# Patient Record
Sex: Female | Born: 1995
Health system: Southern US, Community
[De-identification: ages and names within clinical notes are randomized; demographics above are authoritative.]

## PROBLEM LIST (undated history)

## (undated) DIAGNOSIS — D241 Benign neoplasm of right breast: Secondary | ICD-10-CM

## (undated) DIAGNOSIS — G43909 Migraine, unspecified, not intractable, without status migrainosus: Secondary | ICD-10-CM

## (undated) DIAGNOSIS — Z23 Encounter for immunization: Secondary | ICD-10-CM

## (undated) DIAGNOSIS — IMO0001 Reserved for inherently not codable concepts without codable children: Secondary | ICD-10-CM

## (undated) HISTORY — PX: NO PAST SURGERIES: SHX2092

## (undated) HISTORY — DX: Migraine, unspecified, not intractable, without status migrainosus: G43.909

## (undated) HISTORY — DX: Encounter for immunization: Z23

## (undated) HISTORY — DX: Reserved for inherently not codable concepts without codable children: IMO0001

## (undated) HISTORY — DX: Benign neoplasm of right breast: D24.1

---
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
## 2016-08-22 ENCOUNTER — Ambulatory Visit (INDEPENDENT_AMBULATORY_CARE_PROVIDER_SITE_OTHER): Payer: Self-pay | Admitting: Orthopedic Surgery

## 2016-08-22 DIAGNOSIS — M25561 Pain in right knee: Secondary | ICD-10-CM

## 2016-08-23 ENCOUNTER — Other Ambulatory Visit (INDEPENDENT_AMBULATORY_CARE_PROVIDER_SITE_OTHER): Payer: Self-pay | Admitting: Orthopedic Surgery

## 2016-08-23 DIAGNOSIS — M25562 Pain in left knee: Secondary | ICD-10-CM

## 2016-08-26 ENCOUNTER — Encounter: Payer: Self-pay | Admitting: Orthopedic Surgery

## 2016-08-26 DIAGNOSIS — M25562 Pain in left knee: Secondary | ICD-10-CM | POA: Diagnosis not present

## 2016-08-26 NOTE — Progress Notes (Signed)
   Post-Op Visit Note   Patient: Tina Peterson           Date of Birth: [birth year]           MRN: [id number] Visit Date: 08/22/2016 PCP: No primary care provider on file.   Assessment & Plan:  Chief Complaint: No chief complaint on file.  Visit Diagnoses: No diagnosis found.  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  She was being propped up and then when she came back down she felt a pop in that knee.  Hurts on the medial side.  She does describe some popping past 90.  Weightbearing is difficult.  On exam her collateral and cruciate still stable but she does have some medial joint line tenderness.  There is no posterior lateral rotatory instability noted.  Pedal pulses intact.  Plan MRI scan to evaluate for what seems like may be a meniscal tear.  Occult anterior cruciate ligament injury also possible but her exam is not convincing for that today.  We'll see if the scan shows in regards to [facility] versus medial meniscal tear.  Follow-Up Instructions: Return if symptoms worsen or fail to improve.   Orders:  No orders of the defined types were placed in this encounter.  No orders of the defined types were placed in this encounter.   Imaging: No results found.  PMFS History: There are no active problems to display for this patient.  No past medical history on file.  No family history on file.  No past surgical history on file. Social History   Occupational History  . Not on file.   Social History Main Topics  . Smoking status: Not on file  . Smokeless tobacco: Not on file  . Alcohol use Not on file  . Drug use: Unknown  . Sexual activity: Not on file

## 2016-09-01 ENCOUNTER — Telehealth (INDEPENDENT_AMBULATORY_CARE_PROVIDER_SITE_OTHER): Payer: Self-pay | Admitting: *Deleted

## 2016-09-01 NOTE — Telephone Encounter (Signed)
PT mother called for MRI results, or if pt needs to be seen again. Can call pt with results. Pt has some games coming up that she cheers at and isnt sure if she is able to do this or if she needs a brace.

## 2016-09-01 NOTE — Telephone Encounter (Signed)
Dr Marlou Sa has spoken with trainer who was to speak with patient.

## 2016-09-12 ENCOUNTER — Ambulatory Visit (INDEPENDENT_AMBULATORY_CARE_PROVIDER_SITE_OTHER): Payer: Self-pay | Admitting: Orthopedic Surgery

## 2016-09-12 DIAGNOSIS — M25569 Pain in unspecified knee: Secondary | ICD-10-CM

## 2016-10-24 ENCOUNTER — Ambulatory Visit (INDEPENDENT_AMBULATORY_CARE_PROVIDER_SITE_OTHER): Payer: 59 | Admitting: Obstetrics and Gynecology

## 2016-10-24 ENCOUNTER — Encounter: Payer: Self-pay | Admitting: Obstetrics and Gynecology

## 2016-10-24 VITALS — BP 110/70 | Ht 65.0 in | Wt 105.0 lb

## 2016-10-24 DIAGNOSIS — Z3041 Encounter for surveillance of contraceptive pills: Secondary | ICD-10-CM | POA: Diagnosis not present

## 2016-10-24 DIAGNOSIS — Z01419 Encounter for gynecological examination (general) (routine) without abnormal findings: Secondary | ICD-10-CM

## 2016-10-24 DIAGNOSIS — Z113 Encounter for screening for infections with a predominantly sexual mode of transmission: Secondary | ICD-10-CM

## 2016-10-24 MED ORDER — LEVONORGESTREL-ETHINYL ESTRAD 0.1-20 MG-MCG PO TABS: 1.0000 | ORAL_TABLET | Freq: Every day | ORAL | 12 refills | Status: DC

## 2016-10-24 NOTE — Progress Notes (Signed)
HPI:      [name] is a 21 y.o. G0P0000 who LMP was Patient's last menstrual period was 10/17/2016., presents today for her annual examination.  Her menses are monthly on OCPs, but occur during different times of her pill pack, including active pills (without late/missed OCPs), lasting 5 days.  Dysmenorrhea none. She does not have intermenstrual bleeding.  Sex activity: single partner, contraception - OCP (estrogen/progesterone).   Hx of STDs: none  There is no FH of breast cancer. There is no FH of ovarian cancer. The patient does not do self-breast exams.  Tobacco use: The patient denies current or previous tobacco use. Alcohol use: none Exercise: very active  She does not get adequate calcium and Vitamin D in her diet.    History reviewed. No pertinent past medical history.  History reviewed. No pertinent surgical history.  Family History  Problem Relation Age of Onset  . Hypertension Father   . Migraines Paternal Grandfather      ROS:  Review of Systems  Constitutional: Negative for fever, malaise/fatigue and weight loss.  HENT: Negative for congestion, ear pain and sinus pain.   Respiratory: Negative for cough, shortness of breath and wheezing.   Cardiovascular: Negative for chest pain, orthopnea and leg swelling.  Gastrointestinal: Negative for constipation, diarrhea, nausea and vomiting.  Genitourinary: Negative for dysuria, frequency, hematuria and urgency.       Breast ROS: negative   Musculoskeletal: Negative for back pain, joint pain and myalgias.  Skin: Negative for itching and rash.  Neurological: Negative for dizziness, tingling, focal weakness and headaches.  Endo/Heme/Allergies: Negative for environmental allergies. Does not bruise/bleed easily.  Psychiatric/Behavioral: Negative for depression and suicidal ideas. The patient is not nervous/anxious and does not have insomnia.     Objective: BP 110/70   Ht 5\' 5"  (1.651 m)   Wt 105 lb (47.6  kg)   LMP 10/17/2016   BMI 17.47 kg/m    Physical Exam  Constitutional: She is oriented to person, place, and time. She appears well-developed and well-nourished.  Genitourinary: Vagina normal and uterus normal. No erythema or tenderness in the vagina. No vaginal discharge found. Right adnexum does not display mass and does not display tenderness. Left adnexum does not display mass and does not display tenderness. Cervix does not exhibit motion tenderness or polyp. Uterus is not enlarged or tender.  Neck: Normal range of motion. No thyromegaly present.  Cardiovascular: Normal rate, regular rhythm and normal heart sounds.   No murmur heard. Pulmonary/Chest: Effort normal and breath sounds normal. Right breast exhibits no mass, no nipple discharge, no skin change and no tenderness. Left breast exhibits no mass, no nipple discharge, no skin change and no tenderness.  Abdominal: Soft. There is no tenderness. There is no guarding.  Musculoskeletal: Normal range of motion.  Neurological: She is alert and oriented to person, place, and time. No cranial nerve deficit.  Psychiatric: She has a normal mood and affect. Her behavior is normal.  Vitals reviewed.   Results: No results found for this or any previous visit (from the past 24 hour(s)).  Assessment/Plan: Encounter for annual routine gynecological examination  Screening for STD (sexually transmitted disease) - Plan: Chlamydia/Gonococcus/Trichomonas, NAA  Encounter for surveillance of contraceptive pills - Change OCPs due to irregular cycles on Lo Loestrin. Rx aviane. F/u prn. - Plan: levonorgestrel-ethinyl estradiol (AVIANE) 0.1-20 MG-MCG tablet    Meds ordered this encounter  Medications  . DISCONTD: LO LOESTRIN FE 1 MG-10 MCG / 10  MCG tablet    Refill:  3  . levonorgestrel-ethinyl estradiol (AVIANE) 0.1-20 MG-MCG tablet    Sig: Take 1 tablet by mouth daily.    Dispense:  28 tablet    Refill:  12              GYN counsel STD  prevention, use and side effects of OCP's, adequate intake of calcium and vitamin D     F/U  Return in about 1 year (around 10/24/2017).  Tayjon Halladay B. Keshana Klemz, PA-C 10/24/2016 2:16 PM

## 2016-10-26 LAB — CHLAMYDIA/GONOCOCCUS/TRICHOMONAS, NAA
CHLAMYDIA BY NAA: NEGATIVE
GONOCOCCUS BY NAA: NEGATIVE
Trich vag by NAA: NEGATIVE

## 2016-11-28 NOTE — Progress Notes (Signed)
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  Subsequent MRI scan was unremarkable.  This [facility]  Her exam has improved.  We will let her continue activity as tolerated with no definitive treatment other than standard quad strengthening rehabilitation required at this time

## 2016-12-07 DIAGNOSIS — H52223 Regular astigmatism, bilateral: Secondary | ICD-10-CM | POA: Diagnosis not present

## 2017-05-08 DIAGNOSIS — D241 Benign neoplasm of right breast: Secondary | ICD-10-CM

## 2017-05-08 HISTORY — DX: Benign neoplasm of right breast: D24.1

## 2017-05-19 ENCOUNTER — Telehealth: Payer: Self-pay

## 2017-05-19 NOTE — Telephone Encounter (Signed)
Pt is returning Tina Peterson's call. Please advise

## 2017-05-19 NOTE — Telephone Encounter (Signed)
LM for pt to call

## 2017-05-19 NOTE — Telephone Encounter (Signed)
Pt's mom calling, pt is still having a lot of BTB on LO LO, taking at same time every day.  How long before this subsides?  Also, pt has a cystic type small odule n right breast.  Has had one cycle since first noticed it.  Mom told her to stop caffiene.  Should she just watch it, have an u/s, give it a month or what to do?  [phone] (pt's cell)  [phone] x [phone](mom's w#)

## 2017-05-19 NOTE — Telephone Encounter (Signed)
Left detailed msg need to confirm name of bcp - Aviane or Lo Loestrin.  Pt states she is on Aviane.  At the most she may occasionally be an hour late taking it.  Bleeding is very random even c bcps.  The nodule in right breast hasn't changed since she first noticied it - not even during cycle.

## 2017-05-22 ENCOUNTER — Other Ambulatory Visit: Payer: Self-pay | Admitting: Obstetrics and Gynecology

## 2017-05-22 MED ORDER — NORGESTIMATE-ETH ESTRADIOL 0.25-35 MG-MCG PO TABS: 1.0000 | ORAL_TABLET | Freq: Every day | ORAL | 5 refills | Status: DC

## 2017-05-22 NOTE — Telephone Encounter (Signed)
Pt aware of bc change.  Tx'd to SP to sched breast exam appt.

## 2017-05-22 NOTE — Telephone Encounter (Signed)
Rx OCPs changed again, try sprintec. Needs breast exam for nodule--can't know without seeing/feeling it. May need u/s after eval.

## 2017-05-22 NOTE — Progress Notes (Signed)
Rx change due to BTB on OCPs. Had with Lo Loestrin and aviane. Try sprintec.

## 2017-05-24 ENCOUNTER — Encounter: Payer: Self-pay | Admitting: Obstetrics and Gynecology

## 2017-05-24 ENCOUNTER — Ambulatory Visit (INDEPENDENT_AMBULATORY_CARE_PROVIDER_SITE_OTHER): Payer: 59 | Admitting: Obstetrics and Gynecology

## 2017-05-24 VITALS — BP 110/70 | HR 70 | Ht 65.0 in | Wt 102.0 lb

## 2017-05-24 DIAGNOSIS — N6311 Unspecified lump in the right breast, upper outer quadrant: Secondary | ICD-10-CM | POA: Diagnosis not present

## 2017-05-24 NOTE — Progress Notes (Signed)
   Chief Complaint  Patient presents with  . Breast Mass    HPI:      [name] is a 21 y.o. G0P0000 who LMP was Patient's last menstrual period was 05/17/2017., presents today for RT breast mass for the past month. Pt just noticed it on SBE. No change in size over past month. No tenderness, redness, nipple d/c, trauma to area. No hx of breast masses in the past.  Mat grt aunt with breast cancer in 29s.    History reviewed. No pertinent past medical history.  History reviewed. No pertinent surgical history.  Family History  Problem Relation Age of Onset  . Hypertension Father   . Migraines Paternal Grandfather   . Breast cancer Other 16    Social History   Social History  . Marital status: Single    Spouse name: N/A  . Number of children: N/A  . Years of education: N/A   Occupational History  . Not on file.   Social History Main Topics  . Smoking status: Never Smoker  . Smokeless tobacco: Never Used  . Alcohol use No  . Drug use: No  . Sexual activity: Yes    Birth control/ protection: Pill   Other Topics Concern  . Not on file   Social History Narrative  . No narrative on file     Current Outpatient Prescriptions:  .  norgestimate-ethinyl estradiol (ORTHO-CYCLEN,SPRINTEC,PREVIFEM) 0.25-35 MG-MCG tablet, Take 1 tablet by mouth daily., Disp: 28 tablet, Rfl: 5   ROS:  Review of Systems  Constitutional: Negative for fever.  Gastrointestinal: Negative for blood in stool, constipation, diarrhea, nausea and vomiting.  Genitourinary: Negative for dyspareunia, dysuria, flank pain, frequency, hematuria, urgency, vaginal bleeding, vaginal discharge and vaginal pain.  Musculoskeletal: Negative for back pain.  Skin: Negative for rash.  Review of Systems  Constitutional: Negative for fever.  Gastrointestinal: Negative for blood in stool, constipation, diarrhea, nausea and vomiting.  Genitourinary: Negative for dyspareunia, dysuria, flank pain, frequency,  hematuria, urgency, vaginal bleeding, vaginal discharge and vaginal pain.  Musculoskeletal: Negative for back pain.  Skin: Negative for rash.  Breast ROS: positive for - new or changing breast lumps   OBJECTIVE:   Vitals:  BP 110/70   Pulse 70   Ht 5\' 5"  (1.651 m)   Wt 102 lb (46.3 kg)   LMP 05/17/2017   BMI 16.97 kg/m   Physical Exam  Constitutional: She is oriented to person, place, and time and well-developed, well-nourished, and in no distress.  Pulmonary/Chest: Right breast exhibits mass. Right breast exhibits no inverted nipple, no nipple discharge, no skin change and no tenderness. Left breast exhibits no inverted nipple, no mass, no nipple discharge, no skin change and no tenderness. Breasts are symmetrical.    ~1 CM FIRM, MOBILE, NT MASS 10:00 POS  Lymphadenopathy:    She has no axillary adenopathy.  Neurological: She is alert and oriented to person, place, and time.  Psychiatric: Affect and judgment normal.  Vitals reviewed.   Assessment/Plan: Mass of upper outer quadrant of right breast - 10:00 position. Most likely simple cyst. Check u/s. Will f/u with results.  - Plan: US BREAST LTD UNI RIGHT INC AXILLA    Return if symptoms worsen or fail to improve.  Malikiah Debarr B. Chara Marquard, PA-C 05/24/2017 4:07 PM

## 2017-06-02 ENCOUNTER — Ambulatory Visit
Admission: RE | Admit: 2017-06-02 | Discharge: 2017-06-02 | Disposition: A | Discharge status: Home / Self Care | Payer: 59 | Source: Ambulatory Visit | Attending: Obstetrics and Gynecology | Admitting: Obstetrics and Gynecology

## 2017-06-02 DIAGNOSIS — N6311 Unspecified lump in the right breast, upper outer quadrant: Secondary | ICD-10-CM | POA: Diagnosis not present

## 2017-06-02 DIAGNOSIS — N6489 Other specified disorders of breast: Secondary | ICD-10-CM | POA: Diagnosis not present

## 2017-06-05 ENCOUNTER — Telehealth: Payer: Self-pay | Admitting: Obstetrics and Gynecology

## 2017-06-05 DIAGNOSIS — D241 Benign neoplasm of right breast: Secondary | ICD-10-CM | POA: Insufficient documentation

## 2017-06-05 NOTE — Telephone Encounter (Signed)
LM re: Birads 3 u/s for RT breast probable fibroadenoma. Order placed. F/u prn.

## 2017-09-13 DIAGNOSIS — N390 Urinary tract infection, site not specified: Secondary | ICD-10-CM | POA: Diagnosis not present

## 2017-11-01 ENCOUNTER — Other Ambulatory Visit: Payer: Self-pay | Admitting: Obstetrics and Gynecology

## 2017-11-01 ENCOUNTER — Telehealth: Payer: Self-pay | Admitting: Obstetrics and Gynecology

## 2017-11-01 ENCOUNTER — Ambulatory Visit: Payer: 59 | Admitting: Obstetrics and Gynecology

## 2017-11-01 MED ORDER — NORGESTIMATE-ETH ESTRADIOL 0.25-35 MG-MCG PO TABS: 1.0000 | ORAL_TABLET | Freq: Every day | ORAL | 0 refills | Status: DC

## 2017-11-01 NOTE — Telephone Encounter (Signed)
Done. RN to notify pt.

## 2017-11-01 NOTE — Telephone Encounter (Signed)
Patient was rechedule due to ABC out sick. Patient is reschedule to 11/14/17. Patient is requesting refill on Birthcontrol until next appointment. Patient uses [facility]

## 2017-11-01 NOTE — Telephone Encounter (Signed)
Left VM for pt that Rx is refilled

## 2017-11-01 NOTE — Progress Notes (Signed)
Rx RF till annual 

## 2017-11-14 ENCOUNTER — Ambulatory Visit (INDEPENDENT_AMBULATORY_CARE_PROVIDER_SITE_OTHER): Payer: 59 | Admitting: Obstetrics and Gynecology

## 2017-11-14 ENCOUNTER — Encounter: Payer: Self-pay | Admitting: Obstetrics and Gynecology

## 2017-11-14 VITALS — BP 112/70 | HR 92 | Ht 65.0 in | Wt 102.0 lb

## 2017-11-14 DIAGNOSIS — D241 Benign neoplasm of right breast: Secondary | ICD-10-CM | POA: Diagnosis not present

## 2017-11-14 DIAGNOSIS — Z113 Encounter for screening for infections with a predominantly sexual mode of transmission: Secondary | ICD-10-CM

## 2017-11-14 DIAGNOSIS — Z124 Encounter for screening for malignant neoplasm of cervix: Secondary | ICD-10-CM

## 2017-11-14 DIAGNOSIS — Z3041 Encounter for surveillance of contraceptive pills: Secondary | ICD-10-CM

## 2017-11-14 DIAGNOSIS — Z01419 Encounter for gynecological examination (general) (routine) without abnormal findings: Secondary | ICD-10-CM

## 2017-11-14 DIAGNOSIS — N921 Excessive and frequent menstruation with irregular cycle: Secondary | ICD-10-CM | POA: Diagnosis not present

## 2017-11-14 MED ORDER — NORGESTIMATE-ETH ESTRADIOL 0.25-35 MG-MCG PO TABS: 1.0000 | ORAL_TABLET | Freq: Every day | ORAL | 3 refills | Status: DC

## 2017-11-14 NOTE — Patient Instructions (Signed)
I value your feedback and entrusting us with your care. If you get a [place] patient survey, I would appreciate you taking the time to let us know about your experience today. Thank you! 

## 2017-11-14 NOTE — Progress Notes (Signed)
HPI:      [name] is a 22 y.o. G0P0000 who LMP was Patient's last menstrual period was 10/23/2017 (exact date)., presents today for her annual examination. Her menses were lasting about 3 wks each pill pack on previous OCPs. OCP changed to sprintec for the past month and BTB has improved already. Mild dysmen, relieved with NSAIDs.  Sex activity: single partner, contraception - OCP (estrogen/progesterone).  Hx of STDs: none  There is a FH of breast cancer in her mat grt aunt, genetic testing not indicated. There is no FH of ovarian cancer. The patient does do self-breast exams. Had RT breast mass 10/18 and u/s showed RT breast fibroadenoma, repeat due in 6 months. Area hasn't changed in size, slightly tender due to [facility] change. Has u/s sched 12/05/17.  Tobacco use: The patient denies current or previous tobacco use. Alcohol use: none Exercise: very active  She does not get adequate calcium and Vitamin D in her diet.   Past Medical History:  Diagnosis Date  . 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 Human papilloma virus (HPV) type 9 vaccine administered    series completed  . Migraines     History reviewed. No pertinent surgical history.  Family History  Problem Relation Age of Onset  . Hypertension Father   . Migraines Paternal Grandfather   . Breast cancer Other 27    Social History   Socioeconomic History  . Marital status: Single    Spouse name: Not on file  . Number of children: Not on file  . Years of education: Not on file  . Highest education level: Not on file  Occupational History  . Not on file  Social Needs  . Financial resource strain: Not on file  . Food insecurity:    Worry: Not on file    Inability: Not on file  . Transportation needs:    Medical: Not on file    Non-medical: Not on file  Tobacco Use  . Smoking status: Never Smoker  . Smokeless tobacco: Never Used  Substance and Sexual Activity  . Alcohol use: No  . Drug use: No    . Sexual activity: Yes    Birth control/protection: Pill  Lifestyle  . Physical activity:    Days per week: Not on file    Minutes per session: Not on file  . Stress: Not on file  Relationships  . Social connections:    Talks on phone: Not on file    Gets together: Not on file    Attends religious service: Not on file    Active member of club or organization: Not on file    Attends meetings of clubs or organizations: Not on file    Relationship status: Not on file  . Intimate partner violence:    Fear of current or ex partner: Not on file    Emotionally abused: Not on file    Physically abused: Not on file    Forced sexual activity: Not on file  Other Topics Concern  . Not on file  Social History Narrative  . Not on file    No current outpatient medications on file prior to visit.   No current facility-administered medications on file prior to visit.     ROS:  Review of Systems  Constitutional: Negative for fatigue, fever and unexpected weight change.  Respiratory: Negative for cough, shortness of breath and wheezing.   Cardiovascular: Negative for chest pain, palpitations and leg swelling.  Gastrointestinal: Negative for blood in stool, constipation, diarrhea, nausea and vomiting.  Endocrine: Negative for cold intolerance, heat intolerance and polyuria.  Genitourinary: Negative for dyspareunia, dysuria, flank pain, frequency, genital sores, hematuria, menstrual problem, pelvic pain, urgency, vaginal bleeding, vaginal discharge and vaginal pain.  Musculoskeletal: Negative for back pain, joint swelling and myalgias.  Skin: Negative for rash.  Neurological: Negative for dizziness, syncope, light-headedness, numbness and headaches.  Hematological: Negative for adenopathy.  Psychiatric/Behavioral: Negative for agitation, confusion, sleep disturbance and suicidal ideas. The patient is not nervous/anxious.   BREAST: breast tenderness and mass   Objective: BP 112/70 (BP  Location: Left Arm, Patient Position: Sitting, Cuff Size: Normal)   Pulse 92   Ht 5\' 5"  (1.651 m)   Wt 102 lb (46.3 kg)   LMP 10/23/2017 (Exact Date)   BMI 16.97 kg/m    Physical Exam  Constitutional: She is oriented to person, place, and time. She appears well-developed and well-nourished.  Genitourinary: Vagina normal and uterus normal. There is no rash or tenderness on the right labia. There is no rash or tenderness on the left labia. No erythema or tenderness in the vagina. No vaginal discharge found. Right adnexum does not display mass and does not display tenderness. Left adnexum does not display mass and does not display tenderness. Cervix does not exhibit motion tenderness or polyp. Uterus is not enlarged or tender.  Neck: Normal range of motion. No thyromegaly present.  Cardiovascular: Normal rate, regular rhythm and normal heart sounds.  No murmur heard. Pulmonary/Chest: Effort normal and breath sounds normal. Right breast exhibits mass and tenderness. Right breast exhibits no nipple discharge and no skin change. Left breast exhibits tenderness. Left breast exhibits no mass, no nipple discharge and no skin change.    Abdominal: Soft. There is no tenderness. There is no guarding.  Musculoskeletal: Normal range of motion.  Neurological: She is alert and oriented to person, place, and time. No cranial nerve deficit.  Psychiatric: She has a normal mood and affect. Her behavior is normal.  Vitals reviewed.   Assessment/Plan: Encounter for annual routine gynecological examination  Cervical cancer screening - Plan: IGP,CtNgTv,rfx Aptima HPV ASCU  Screening for STD (sexually transmitted disease) - Plan: IGP,CtNgTv,rfx Aptima HPV ASCU  Encounter for surveillance of contraceptive pills - OCP RF.  - Plan: norgestimate-ethinyl estradiol (ORTHO-CYCLEN,SPRINTEC,PREVIFEM) 0.25-35 MG-MCG tablet  Breakthrough bleeding on birth control pills - F/u prn sx. If sx persist, will change OCPs.    Fibroadenoma of breast, right - Has f/u u/s 12/05/17. Will f/u with results. Sx are stable for pt.  Meds ordered this encounter  Medications  . norgestimate-ethinyl estradiol (ORTHO-CYCLEN,SPRINTEC,PREVIFEM) 0.25-35 MG-MCG tablet    Sig: Take 1 tablet by mouth daily.    Dispense:  84 tablet    Refill:  3    Order Specific Question:   Supervising Provider    Answer:   Gae Dry [[id number]]             GYN counsel breast self exam, adequate intake of calcium and vitamin D, diet and exercise     F/U  Return in about 1 year (around 11/15/2018).  Ruffus Kamaka B. Steel Kerney, PA-C 11/14/2017 2:20 PM

## 2017-11-16 LAB — IGP,CTNGTV,RFX APTIMA HPV ASCU
Chlamydia, Nuc. Acid Amp: NEGATIVE
Gonococcus, Nuc. Acid Amp: NEGATIVE
PAP SMEAR COMMENT: 0
Trich vag by NAA: NEGATIVE

## 2017-12-05 ENCOUNTER — Other Ambulatory Visit: Payer: 59

## 2017-12-11 ENCOUNTER — Other Ambulatory Visit: Payer: 59

## 2017-12-13 ENCOUNTER — Ambulatory Visit
Admission: RE | Admit: 2017-12-13 | Discharge: 2017-12-13 | Disposition: A | Discharge status: Home / Self Care | Payer: 59 | Source: Ambulatory Visit | Attending: Obstetrics and Gynecology | Admitting: Obstetrics and Gynecology

## 2017-12-13 DIAGNOSIS — N6310 Unspecified lump in the right breast, unspecified quadrant: Secondary | ICD-10-CM | POA: Diagnosis present

## 2017-12-13 DIAGNOSIS — D241 Benign neoplasm of right breast: Secondary | ICD-10-CM | POA: Diagnosis not present

## 2017-12-13 DIAGNOSIS — N6311 Unspecified lump in the right breast, upper outer quadrant: Secondary | ICD-10-CM | POA: Diagnosis not present

## 2018-04-25 ENCOUNTER — Telehealth: Payer: Self-pay

## 2018-04-25 NOTE — Telephone Encounter (Signed)
Pt has a question about a Dr.'s note she is needing. She has questions. [phone]

## 2018-04-25 NOTE — Telephone Encounter (Signed)
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 However, she already lives in an apartment. Last year she had 3 UTI's & a Kidney infection last year. She is asking for a Dr's. Note so she doesn't get fined $2,000 for living off campus.

## 2018-04-26 NOTE — Telephone Encounter (Signed)
As soon as I asked pt what part of living in dorm caused the UTI's/kidney infection she said "dorm doesn't have a bath so I cant get in to relax, I mean I shower regularly." Then I asked her if she had gotten treated and she said yes last semester with the doctor at the school campus.

## 2018-04-26 NOTE — Telephone Encounter (Signed)
What part of living in dorm caused UTIs/kidney infection? Not sure they're related. Where was she treated for sx? RN to discuss with pt

## 2018-04-26 NOTE — Telephone Encounter (Signed)
Called pt to get more info. No answer, left vm to call back.

## 2018-04-26 NOTE — Telephone Encounter (Signed)
Patient is returning missed call. Please advise 

## 2018-04-26 NOTE — Telephone Encounter (Signed)
Spoke with pt. Showers are preferred to baths for vaginitis and UTIs, so can't write letter. Suggested pt f/u with MD who treated UTIs for letter. 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

## 2018-06-20 ENCOUNTER — Other Ambulatory Visit: Payer: Self-pay | Admitting: Obstetrics and Gynecology

## 2018-06-20 DIAGNOSIS — N6310 Unspecified lump in the right breast, unspecified quadrant: Secondary | ICD-10-CM

## 2018-07-02 ENCOUNTER — Ambulatory Visit (INDEPENDENT_AMBULATORY_CARE_PROVIDER_SITE_OTHER): Payer: 59 | Admitting: Orthopedic Surgery

## 2018-07-02 DIAGNOSIS — G8929 Other chronic pain: Secondary | ICD-10-CM

## 2018-07-02 DIAGNOSIS — M25562 Pain in left knee: Secondary | ICD-10-CM

## 2018-07-03 ENCOUNTER — Other Ambulatory Visit (INDEPENDENT_AMBULATORY_CARE_PROVIDER_SITE_OTHER): Payer: Self-pay | Admitting: Orthopedic Surgery

## 2018-07-03 DIAGNOSIS — M25562 Pain in left knee: Principal | ICD-10-CM

## 2018-07-03 DIAGNOSIS — G8929 Other chronic pain: Secondary | ICD-10-CM

## 2018-07-09 ENCOUNTER — Encounter (INDEPENDENT_AMBULATORY_CARE_PROVIDER_SITE_OTHER): Payer: Self-pay | Admitting: Orthopedic Surgery

## 2018-07-09 NOTE — Progress Notes (Signed)
   Post-Op Visit Note   Patient: Tina Peterson           Date of Birth: 1996/01/28           MRN: [id number] Visit Date: 07/02/2018 PCP: Patient, No Pcp Per   Assessment & Plan:  Chief Complaint: No chief complaint on file.  Visit Diagnoses:  1. 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  She was seen in the sophomore and MRI scan showed some tendinosis.  She is been doing reasonably well but did develop pain over the past several months involving the entire knee.  She reports pain and numbness with tumbling.  She uses a patellar tendon strap.  Ibuprofen is not been helpful.  She states that her knee hurts her all the time.  She is had no prior injections.  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  On exam she has full active and passive range of motion of the left knee with both medial and lateral joint line tenderness.  Extensor mechanism is intact.  Collateral crucial ligaments are stable.  No groin pain with internal and external rotation of the leg.  No nerve retention signs.  Negative patellar apprehension.  Prior radiographs unremarkable.  Impression left knee pain with global type pain of unclear etiology.  Patient does potentially plan to do a lot of cheerleading training which involves demonstrating moves and choreography.  Is been going on for several months.  Is affecting her tumbling.  No real focal tenderness on the patellar or quad tendon at this time.  I think MRI scan of the left knee is indicated to rule out anything atypical such as a stress reaction or stress fracture.  I will see her back after that study  Follow-Up Instructions: Return for after MRI.   Orders:  No orders of the defined types were placed in this encounter.  No orders of the defined types were placed in this encounter.   Imaging: No results found.  PMFS History: Patient Active Problem List   Diagnosis Date Noted  . Fibroadenoma of breast,  right 06/05/2017  . Mass of upper outer quadrant of right breast 05/24/2017   Past Medical History:  Diagnosis Date  . 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 Human papilloma virus (HPV) type 9 vaccine administered    series completed  . Migraines     Family History  Problem Relation Age of Onset  . Hypertension Father   . Migraines Paternal Grandfather   . Breast cancer Other 60    No past surgical history on file. Social History   Occupational History  . Not on file  Tobacco Use  . Smoking status: Never Smoker  . Smokeless tobacco: Never Used  Substance and Sexual Activity  . Alcohol use: No  . Drug use: No  . Sexual activity: Yes    Birth control/protection: Pill

## 2018-07-12 ENCOUNTER — Ambulatory Visit
Admission: RE | Admit: 2018-07-12 | Discharge: 2018-07-12 | Disposition: A | Discharge status: Home / Self Care | Payer: 59 | Source: Ambulatory Visit | Attending: Obstetrics and Gynecology | Admitting: Obstetrics and Gynecology

## 2018-07-12 DIAGNOSIS — N6310 Unspecified lump in the right breast, unspecified quadrant: Secondary | ICD-10-CM | POA: Insufficient documentation

## 2018-07-12 DIAGNOSIS — N6311 Unspecified lump in the right breast, upper outer quadrant: Secondary | ICD-10-CM | POA: Diagnosis not present

## 2018-07-16 ENCOUNTER — Encounter: Payer: Self-pay | Admitting: Obstetrics and Gynecology

## 2018-07-17 ENCOUNTER — Encounter (INDEPENDENT_AMBULATORY_CARE_PROVIDER_SITE_OTHER): Payer: Self-pay | Admitting: *Deleted

## 2018-07-31 ENCOUNTER — Ambulatory Visit
Admission: RE | Admit: 2018-07-31 | Discharge: 2018-07-31 | Disposition: A | Discharge status: Home / Self Care | Payer: 59 | Source: Ambulatory Visit | Attending: Orthopedic Surgery | Admitting: Orthopedic Surgery

## 2018-07-31 DIAGNOSIS — S8992XA Unspecified injury of left lower leg, initial encounter: Secondary | ICD-10-CM | POA: Diagnosis not present

## 2018-07-31 DIAGNOSIS — G8929 Other chronic pain: Secondary | ICD-10-CM

## 2018-07-31 DIAGNOSIS — M25562 Pain in left knee: Principal | ICD-10-CM

## 2018-10-23 ENCOUNTER — Other Ambulatory Visit: Payer: Self-pay | Admitting: Obstetrics and Gynecology

## 2018-10-23 DIAGNOSIS — Z3041 Encounter for surveillance of contraceptive pills: Secondary | ICD-10-CM

## 2018-11-22 ENCOUNTER — Ambulatory Visit: Payer: 59 | Admitting: Obstetrics and Gynecology

## 2019-01-15 ENCOUNTER — Encounter: Payer: Self-pay | Admitting: Obstetrics and Gynecology

## 2019-01-15 ENCOUNTER — Other Ambulatory Visit: Payer: Self-pay

## 2019-01-15 ENCOUNTER — Ambulatory Visit (INDEPENDENT_AMBULATORY_CARE_PROVIDER_SITE_OTHER): Payer: 59 | Admitting: Obstetrics and Gynecology

## 2019-01-15 ENCOUNTER — Other Ambulatory Visit (HOSPITAL_COMMUNITY)
Admission: RE | Admit: 2019-01-15 | Discharge: 2019-01-15 | Disposition: A | Discharge status: Home / Self Care | Payer: 59 | Source: Ambulatory Visit | Attending: Obstetrics and Gynecology | Admitting: Obstetrics and Gynecology

## 2019-01-15 VITALS — BP 110/80 | Ht 65.0 in | Wt 103.8 lb

## 2019-01-15 DIAGNOSIS — N6310 Unspecified lump in the right breast, unspecified quadrant: Secondary | ICD-10-CM

## 2019-01-15 DIAGNOSIS — Z113 Encounter for screening for infections with a predominantly sexual mode of transmission: Secondary | ICD-10-CM | POA: Diagnosis not present

## 2019-01-15 DIAGNOSIS — Z3041 Encounter for surveillance of contraceptive pills: Secondary | ICD-10-CM

## 2019-01-15 DIAGNOSIS — Z01419 Encounter for gynecological examination (general) (routine) without abnormal findings: Secondary | ICD-10-CM | POA: Diagnosis not present

## 2019-01-15 DIAGNOSIS — Z124 Encounter for screening for malignant neoplasm of cervix: Secondary | ICD-10-CM | POA: Diagnosis not present

## 2019-01-15 DIAGNOSIS — D241 Benign neoplasm of right breast: Secondary | ICD-10-CM

## 2019-01-15 MED ORDER — NORGESTIMATE-ETH ESTRADIOL 0.25-35 MG-MCG PO TABS: 1.0000 | ORAL_TABLET | Freq: Every day | ORAL | 3 refills | Status: DC

## 2019-01-15 NOTE — Progress Notes (Signed)
Chief Complaint  Patient presents with  . Gynecologic Exam     HPI:      [name] is a 23 y.o. G0P0000 who LMP was Patient's last menstrual period was 12/30/2018 (approximate)., presents today for her annual examination. Her menses are monthly, lasting 5-6 days, mod flow. No dysmen, BTB resolved with OCP change last yr.   Sex activity: not currently; on OCPs. Last pap: 11/14/17  Results: normal Hx of STDs: none  There is a FH of breast cancer in her mat grt aunt, genetic testing not indicated. There is no FH of ovarian cancer. The patient does do self-breast exams. Had RT breast mass 10/18 and u/s showed RT breast fibroadenoma, several stable repeat u/s since. Area hasn't changed in size. Next u/s due 12/20. Has noticed a new RT breast mass about a month ago. Slightly tender with palpation. No erythema, skin changes, nipple d/c.   Tobacco use: The patient denies current or previous tobacco use. Alcohol use: none Exercise: very active  She does get adequate calcium but not Vitamin D in her diet. Gardasil completed.   Recurrent UTIs from last yr resolved.   Past Medical History:  Diagnosis Date  . 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 Human papilloma virus (HPV) type 9 vaccine administered    series completed  . Migraines     History reviewed. No pertinent surgical history.  Family History  Problem Relation Age of Onset  . Hypertension Father   . Migraines Paternal Grandfather   . Alzheimer's disease Paternal Grandfather   . Hypertension Paternal Grandfather   . Breast cancer Other 60  . Alzheimer's disease Paternal Grandmother   . Hypertension Paternal Grandmother     Social History   Socioeconomic History  . Marital status: Single    Spouse name: Not on file  . Number of children: Not on file  . Years of education: Not on file  . Highest education level: Not on file  Occupational History  . Not on file  Social Needs  . Financial resource  strain: Not on file  . Food insecurity:    Worry: Not on file    Inability: Not on file  . Transportation needs:    Medical: Not on file    Non-medical: Not on file  Tobacco Use  . Smoking status: Never Smoker  . Smokeless tobacco: Never Used  Substance and Sexual Activity  . Alcohol use: No  . Drug use: No  . Sexual activity: Not Currently    Birth control/protection: Pill  Lifestyle  . Physical activity:    Days per week: Not on file    Minutes per session: Not on file  . Stress: Not on file  Relationships  . Social connections:    Talks on phone: Not on file    Gets together: Not on file    Attends religious service: Not on file    Active member of club or organization: Not on file    Attends meetings of clubs or organizations: Not on file    Relationship status: Not on file  . Intimate partner violence:    Fear of current or ex partner: Not on file    Emotionally abused: Not on file    Physically abused: Not on file    Forced sexual activity: Not on file  Other Topics Concern  . Not on file  Social History Narrative  . Not on file    No current outpatient medications on  file prior to visit.   No current facility-administered medications on file prior to visit.     ROS:  Review of Systems  Constitutional: Negative for fatigue, fever and unexpected weight change.  Respiratory: Negative for cough, shortness of breath and wheezing.   Cardiovascular: Negative for chest pain, palpitations and leg swelling.  Gastrointestinal: Negative for blood in stool, constipation, diarrhea, nausea and vomiting.  Endocrine: Negative for cold intolerance, heat intolerance and polyuria.  Genitourinary: Negative for dyspareunia, dysuria, flank pain, frequency, genital sores, hematuria, menstrual problem, pelvic pain, urgency, vaginal bleeding, vaginal discharge and vaginal pain.  Musculoskeletal: Negative for back pain, joint swelling and myalgias.  Skin: Negative for rash.   Neurological: Negative for dizziness, syncope, light-headedness, numbness and headaches.  Hematological: Negative for adenopathy.  Psychiatric/Behavioral: Negative for agitation, confusion, sleep disturbance and suicidal ideas. The patient is not nervous/anxious.   BREAST: breast tenderness and mass   Objective: BP 110/80   Ht 5\' 5"  (1.651 m)   Wt 103 lb 12.8 oz (47.1 kg)   LMP 12/30/2018 (Approximate)   BMI 17.27 kg/m    Physical Exam Constitutional:      Appearance: She is well-developed.  Genitourinary:     Vulva, vagina, uterus, right adnexa and left adnexa normal.     No vulval lesion or tenderness noted.     No vaginal discharge, erythema or tenderness.     No cervical motion tenderness or polyp.     Uterus is not enlarged or tender.     No right or left adnexal mass present.     Right adnexa not tender.     Left adnexa not tender.  Neck:     Musculoskeletal: Normal range of motion.     Thyroid: No thyromegaly.  Cardiovascular:     Rate and Rhythm: Normal rate and regular rhythm.     Heart sounds: Normal heart sounds. No murmur.  Pulmonary:     Effort: Pulmonary effort is normal.     Breath sounds: Normal breath sounds.  Chest:     Breasts:        Right: Mass and tenderness present. No nipple discharge or skin change.        Left: Tenderness present. No mass, nipple discharge or skin change.    Abdominal:     Palpations: Abdomen is soft.     Tenderness: There is no abdominal tenderness. There is no guarding.  Musculoskeletal: Normal range of motion.  Neurological:     General: No focal deficit present.     Mental Status: She is alert and oriented to person, place, and time.     Cranial Nerves: No cranial nerve deficit.  Skin:    General: Skin is warm and dry.  Psychiatric:        Mood and Affect: Mood normal.        Behavior: Behavior normal.        Thought Content: Thought content normal.        Judgment: Judgment normal.  Vitals signs reviewed.      Assessment/Plan: Encounter for annual routine gynecological examination  Cervical cancer screening - Plan: Cytology - PAP  Screening for STD (sexually transmitted disease) - Plan: Cytology - PAP  Encounter for surveillance of contraceptive pills - OCP RF. - Plan: norgestimate-ethinyl estradiol (MONO-LINYAH) 0.25-35 MG-MCG tablet  Fibroadenoma of breast, right - Stable RT breast fibroadenoma 11:00 pos. Next u/s due 12/20.  Breast mass, right - New RT breast mass 3:30 pos. Check breast u/s. Will  f/u with results.  - Plan: US BREAST LTD UNI RIGHT INC AXILLA  Meds ordered this encounter  Medications  . norgestimate-ethinyl estradiol (MONO-LINYAH) 0.25-35 MG-MCG tablet    Sig: Take 1 tablet by mouth daily.    Dispense:  84 tablet    Refill:  3    Order Specific Question:   Supervising Provider    Answer:   Gae Dry [[id number]]             GYN counsel breast self exam, adequate intake of calcium and vitamin D, diet and exercise     F/U  Return in about 1 year (around 01/15/2020).  Alicia B. Copland, PA-C 01/15/2019 11:31 AM

## 2019-01-15 NOTE — Patient Instructions (Signed)
I value your feedback and entrusting us with your care. If you get a [facility] patient survey, I would appreciate you taking the time to let us know about your experience today. Thank you! 

## 2019-01-17 LAB — CYTOLOGY - PAP
Chlamydia: NEGATIVE
Diagnosis: NEGATIVE
Neisseria Gonorrhea: NEGATIVE

## 2019-01-24 ENCOUNTER — Ambulatory Visit
Admission: RE | Admit: 2019-01-24 | Discharge: 2019-01-24 | Disposition: A | Discharge status: Home / Self Care | Payer: 59 | Source: Ambulatory Visit | Attending: Obstetrics and Gynecology | Admitting: Obstetrics and Gynecology

## 2019-01-24 ENCOUNTER — Other Ambulatory Visit: Payer: Self-pay

## 2019-01-24 DIAGNOSIS — N6314 Unspecified lump in the right breast, lower inner quadrant: Secondary | ICD-10-CM | POA: Diagnosis not present

## 2019-01-24 DIAGNOSIS — N6310 Unspecified lump in the right breast, unspecified quadrant: Secondary | ICD-10-CM | POA: Diagnosis not present

## 2019-01-25 ENCOUNTER — Other Ambulatory Visit: Payer: Self-pay | Admitting: Obstetrics and Gynecology

## 2019-01-25 DIAGNOSIS — N6310 Unspecified lump in the right breast, unspecified quadrant: Secondary | ICD-10-CM

## 2019-02-12 DIAGNOSIS — H52223 Regular astigmatism, bilateral: Secondary | ICD-10-CM | POA: Diagnosis not present

## 2019-07-15 ENCOUNTER — Other Ambulatory Visit: Payer: 59

## 2019-07-16 ENCOUNTER — Ambulatory Visit
Admission: RE | Admit: 2019-07-16 | Discharge: 2019-07-16 | Disposition: A | Discharge status: Home / Self Care | Payer: 59 | Source: Ambulatory Visit | Attending: Obstetrics and Gynecology | Admitting: Obstetrics and Gynecology

## 2019-07-16 DIAGNOSIS — N6311 Unspecified lump in the right breast, upper outer quadrant: Secondary | ICD-10-CM | POA: Diagnosis not present

## 2019-07-16 DIAGNOSIS — N6310 Unspecified lump in the right breast, unspecified quadrant: Secondary | ICD-10-CM | POA: Diagnosis not present

## 2019-07-16 DIAGNOSIS — N6314 Unspecified lump in the right breast, lower inner quadrant: Secondary | ICD-10-CM | POA: Diagnosis not present

## 2019-08-27 ENCOUNTER — Ambulatory Visit (INDEPENDENT_AMBULATORY_CARE_PROVIDER_SITE_OTHER): Payer: 59 | Admitting: Obstetrics and Gynecology

## 2019-08-27 ENCOUNTER — Other Ambulatory Visit: Payer: Self-pay

## 2019-08-27 ENCOUNTER — Encounter: Payer: Self-pay | Admitting: Obstetrics and Gynecology

## 2019-08-27 VITALS — BP 132/84 | Ht 65.0 in | Wt 107.8 lb

## 2019-08-27 DIAGNOSIS — N760 Acute vaginitis: Secondary | ICD-10-CM | POA: Diagnosis not present

## 2019-08-27 DIAGNOSIS — B9689 Other specified bacterial agents as the cause of diseases classified elsewhere: Secondary | ICD-10-CM

## 2019-08-27 DIAGNOSIS — R350 Frequency of micturition: Secondary | ICD-10-CM | POA: Diagnosis not present

## 2019-08-27 LAB — POCT WET PREP WITH KOH
Clue Cells Wet Prep HPF POC: POSITIVE
KOH Prep POC: NEGATIVE
Trichomonas, UA: NEGATIVE
Yeast Wet Prep HPF POC: NEGATIVE

## 2019-08-27 LAB — POCT URINALYSIS DIPSTICK
Bilirubin, UA: NEGATIVE
Blood, UA: NEGATIVE
Glucose, UA: NEGATIVE
Ketones, UA: NEGATIVE
Leukocytes, UA: NEGATIVE
Nitrite, UA: NEGATIVE
Protein, UA: NEGATIVE
Spec Grav, UA: 1.02 (ref 1.010–1.025)
pH, UA: 5 (ref 5.0–8.0)

## 2019-08-27 MED ORDER — METRONIDAZOLE 500 MG PO TABS: 500.0000 mg | ORAL_TABLET | Freq: Two times a day (BID) | ORAL | 0 refills | Status: AC

## 2019-08-27 NOTE — Patient Instructions (Signed)
I value your feedback and entrusting us with your care. If you get a [facility] patient survey, I would appreciate you taking the time to let us know about your experience today. Thank you!  As of July 18, 2019, your lab results will be released to your MyChart immediately, before I even have a chance to see them. Please give me time to review them and contact you if there are any abnormalities. Thank you for your patience.  

## 2019-08-27 NOTE — Progress Notes (Signed)
Kaede Clendenen, [name], PA-C   Chief Complaint  Patient presents with  . Vaginitis    discharge w/ odor    HPI:      [name] is a 24 y.o. G0P0000 who LMP was Patient's last menstrual period was 08/05/2019 (exact date)., presents today for increased vag d/c with odor, mild irritation, for the past wk. Partner also noticed odor during sex. Pt has had urinary frequency/urgency, pelvic discomfort recently too. No hematuria, dysuria. Hx of UTIs in past. She is sex active, no new partners, on OCPs.  Neg pap/STD testing at 6/20 annual.   Patient Active Problem List   Diagnosis Date Noted  . Fibroadenoma of breast, right 06/05/2017  . Mass of upper outer quadrant of right breast 05/24/2017    History reviewed. No pertinent surgical history.  Family History  Problem Relation Age of Onset  . Hypertension Father   . Migraines Paternal Grandfather   . Alzheimer's disease Paternal Grandfather   . Hypertension Paternal Grandfather   . Breast cancer Other 60  . Alzheimer's disease Paternal Grandmother   . Hypertension Paternal Grandmother     Social History   Socioeconomic History  . Marital status: Single    Spouse name: Not on file  . Number of children: Not on file  . Years of education: Not on file  . Highest education level: Not on file  Occupational History  . Not on file  Tobacco Use  . Smoking status: Never Smoker  . Smokeless tobacco: Never Used  Substance and Sexual Activity  . Alcohol use: No  . Drug use: No  . Sexual activity: Yes    Birth control/protection: Pill  Other Topics Concern  . Not on file  Social History Narrative  . Not on file   Social Determinants of Health   Financial Resource Strain:   . Difficulty of Paying Living Expenses: Not on file  Food Insecurity:   . Worried About Charity fundraiser in the Last Year: Not on file  . Ran Out of Food in the Last Year: Not on file  Transportation Needs:   . Lack of Transportation (Medical):  Not on file  . Lack of Transportation (Non-Medical): Not on file  Physical Activity:   . Days of Exercise per Week: Not on file  . Minutes of Exercise per Session: Not on file  Stress:   . Feeling of Stress : Not on file  Social Connections:   . Frequency of Communication with Friends and Family: Not on file  . Frequency of Social Gatherings with Friends and Family: Not on file  . Attends Religious Services: Not on file  . Active Member of Clubs or Organizations: Not on file  . Attends Archivist Meetings: Not on file  . Marital Status: Not on file  Intimate Partner Violence:   . Fear of Current or Ex-Partner: Not on file  . Emotionally Abused: Not on file  . Physically Abused: Not on file  . Sexually Abused: Not on file    Outpatient Medications Prior to Visit  Medication Sig Dispense Refill  . norgestimate-ethinyl estradiol (MONO-LINYAH) 0.25-35 MG-MCG tablet Take 1 tablet by mouth daily. 84 tablet 3   No facility-administered medications prior to visit.      ROS:  Review of Systems  Constitutional: Negative for fever.  Gastrointestinal: Negative for blood in stool, constipation, diarrhea, nausea and vomiting.  Genitourinary: Positive for dyspareunia, frequency, urgency and vaginal discharge. Negative for dysuria, flank  pain, hematuria, vaginal bleeding and vaginal pain.  Musculoskeletal: Negative for back pain.  Skin: Negative for rash.  BREAST: No symptoms   OBJECTIVE:   Vitals:  BP 132/84   Ht 5\' 5"  (1.651 m)   Wt 107 lb 12.8 oz (48.9 kg)   LMP 08/05/2019 (Exact Date)   BMI 17.94 kg/m   Physical Exam Vitals reviewed.  Constitutional:      Appearance: She is well-developed.  Pulmonary:     Effort: Pulmonary effort is normal.  Genitourinary:    General: Normal vulva.     Pubic Area: No rash.      Labia:        Right: No rash, tenderness or lesion.        Left: No rash, tenderness or lesion.      Vagina: Vaginal discharge present. No erythema  or tenderness.     Cervix: Normal.     Uterus: Normal. Not enlarged and not tender.      Adnexa: Right adnexa normal and left adnexa normal.       Right: No mass or tenderness.         Left: No mass or tenderness.    Musculoskeletal:        General: Normal range of motion.     Cervical back: Normal range of motion.  Skin:    General: Skin is warm and dry.  Neurological:     General: No focal deficit present.     Mental Status: She is alert and oriented to person, place, and time.  Psychiatric:        Mood and Affect: Mood normal.        Behavior: Behavior normal.        Thought Content: Thought content normal.        Judgment: Judgment normal.     Results: Results for orders placed or performed in visit on 08/27/19 (from the past 24 hour(s))  POCT Urinalysis Dipstick     Status: Normal   Collection Time: 08/27/19  4:44 PM  Result Value Ref Range   Color, UA yellow    Clarity, UA clear    Glucose, UA Negative Negative   Bilirubin, UA neg    Ketones, UA neg    Spec Grav, UA 1.020 1.010 - 1.025   Blood, UA neg    pH, UA 5.0 5.0 - 8.0   Protein, UA Negative Negative   Urobilinogen, UA     Nitrite, UA neg    Leukocytes, UA Negative Negative   Appearance     Odor    POCT Wet Prep with KOH     Status: Abnormal   Collection Time: 08/27/19  4:45 PM  Result Value Ref Range   Trichomonas, UA Negative    Clue Cells Wet Prep HPF POC pos    Epithelial Wet Prep HPF POC     Yeast Wet Prep HPF POC neg    Bacteria Wet Prep HPF POC     RBC Wet Prep HPF POC     WBC Wet Prep HPF POC     KOH Prep POC Negative Negative     Assessment/Plan: Bacterial vaginosis - Plan: metroNIDAZOLE (FLAGYL) 500 MG tablet, POCT Wet Prep with KOH; Pos sx and wet prep. Rx flagyl. No EtOH. Will RF if sx recur. F/u prn.   Urinary frequency - Plan: POCT Urinalysis Dipstick; neg UA. See if sx improve with BV tx. F/u prn.   Meds ordered this encounter  Medications  .  metroNIDAZOLE (FLAGYL) 500 MG tablet     Sig: Take 1 tablet (500 mg total) by mouth 2 (two) times daily for 7 days.    Dispense:  14 tablet    Refill:  0    Order Specific Question:   Supervising Provider    Answer:   Gae Dry [id number]      Return if symptoms worsen or fail to improve.  Crewe Heathman B. Amandeep Hogston, PA-C 08/27/2019 4:46 PM

## 2019-10-07 ENCOUNTER — Ambulatory Visit: Payer: 59 | Attending: Internal Medicine

## 2019-10-07 DIAGNOSIS — Z23 Encounter for immunization: Secondary | ICD-10-CM | POA: Insufficient documentation

## 2019-10-07 NOTE — Progress Notes (Signed)
   Covid-19 Vaccination Clinic  Name:  [name]    MRN: [id number] DOB: 1996-05-16  10/07/2019  [name] was observed post Covid-19 immunization for 15 minutes without incidence. She was provided with Vaccine Information Sheet and instruction to access the V-Safe system.   [name] was instructed to call 911 with any severe reactions post vaccine: Marland Kitchen Difficulty breathing  . Swelling of your face and throat  . A fast heartbeat  . A bad rash all over your body  . Dizziness and weakness    Immunizations Administered    Name Date Dose VIS Date Route   Pfizer COVID-19 Vaccine 10/07/2019  3:01 PM 0.3 mL 07/19/2019 Intramuscular   Manufacturer: [place]   Lot: [id number]   [place]: [id number]

## 2019-10-30 ENCOUNTER — Other Ambulatory Visit: Payer: Self-pay

## 2019-10-30 ENCOUNTER — Ambulatory Visit: Payer: 59 | Attending: Internal Medicine

## 2019-10-30 DIAGNOSIS — Z23 Encounter for immunization: Secondary | ICD-10-CM

## 2019-10-30 NOTE — Progress Notes (Signed)
   Covid-19 Vaccination Clinic  Name:  Tina Peterson    MRN: [id number] DOB: [birth year]  10/30/2019  [name] was observed post Covid-19 immunization for 15 minutes without incident. She was provided with Vaccine Information Sheet and instruction to access the V-Safe system.   [name] was instructed to call 911 with any severe reactions post vaccine: Marland Kitchen Difficulty breathing  . Swelling of face and throat  . A fast heartbeat  . A bad rash all over body  . Dizziness and weakness   Immunizations Administered    Name Date Dose VIS Date Route   Pfizer COVID-19 Vaccine 10/30/2019  2:51 PM 0.3 mL 07/19/2019 Intramuscular   Manufacturer: [place]   Lot: [id number]   [place]: [id number]

## 2019-12-26 ENCOUNTER — Other Ambulatory Visit: Payer: Self-pay | Admitting: Obstetrics and Gynecology

## 2019-12-26 ENCOUNTER — Other Ambulatory Visit: Payer: Self-pay

## 2019-12-26 DIAGNOSIS — R928 Other abnormal and inconclusive findings on diagnostic imaging of breast: Secondary | ICD-10-CM

## 2019-12-26 DIAGNOSIS — Z3041 Encounter for surveillance of contraceptive pills: Secondary | ICD-10-CM

## 2019-12-26 DIAGNOSIS — N6310 Unspecified lump in the right breast, unspecified quadrant: Secondary | ICD-10-CM

## 2019-12-26 MED ORDER — NORGESTIMATE-ETH ESTRADIOL 0.25-35 MG-MCG PO TABS: 1.0000 | ORAL_TABLET | Freq: Every day | ORAL | 3 refills | Status: DC

## 2020-01-14 IMAGING — US ULTRASOUND RIGHT BREAST LIMITED
1 series · 7 of 7 positions shown · non-contrast
Comparison: Previous exam(s).

CLINICAL DATA: 23-year-old patient palpates a new lump in the right
breast, in the [DATE] position. She is currently being followed for a
probable fibroadenoma in the [DATE] position of the right breast,
with her next ultrasound follow-up scheduled in July 2019.

EXAM:
ULTRASOUND OF THE RIGHT BREAST

[Series 1: ultrasound right breast limited · 0.05mm/px · 7 of 7 slices shown]
[im 1/7]
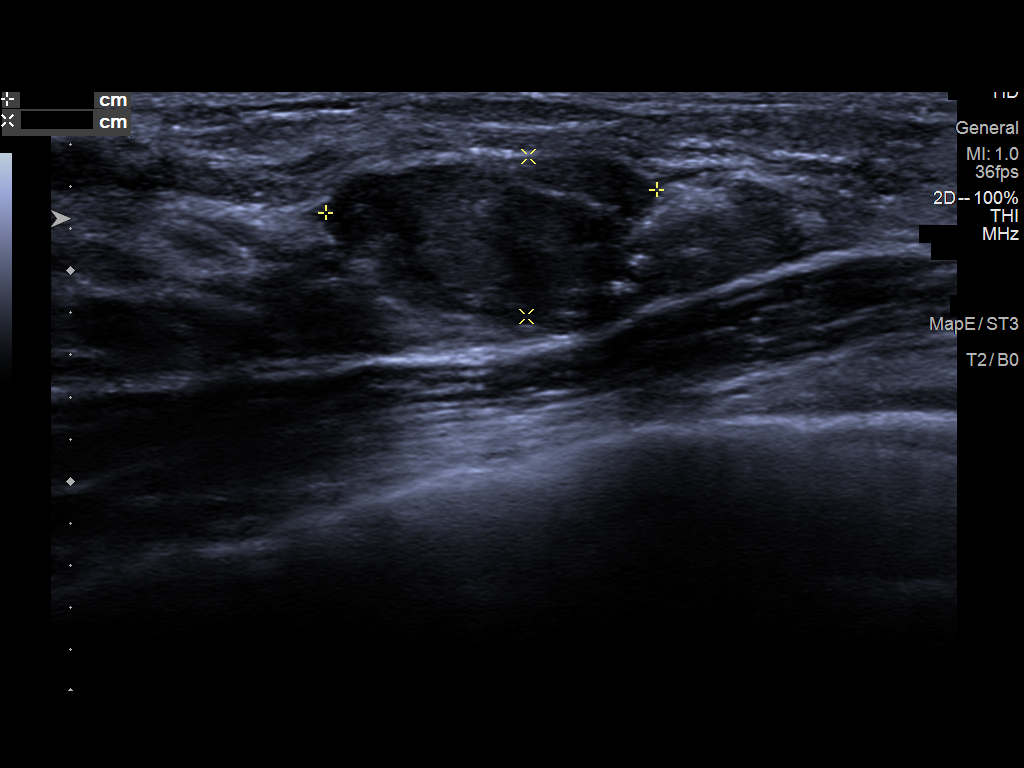
[im 2/7]
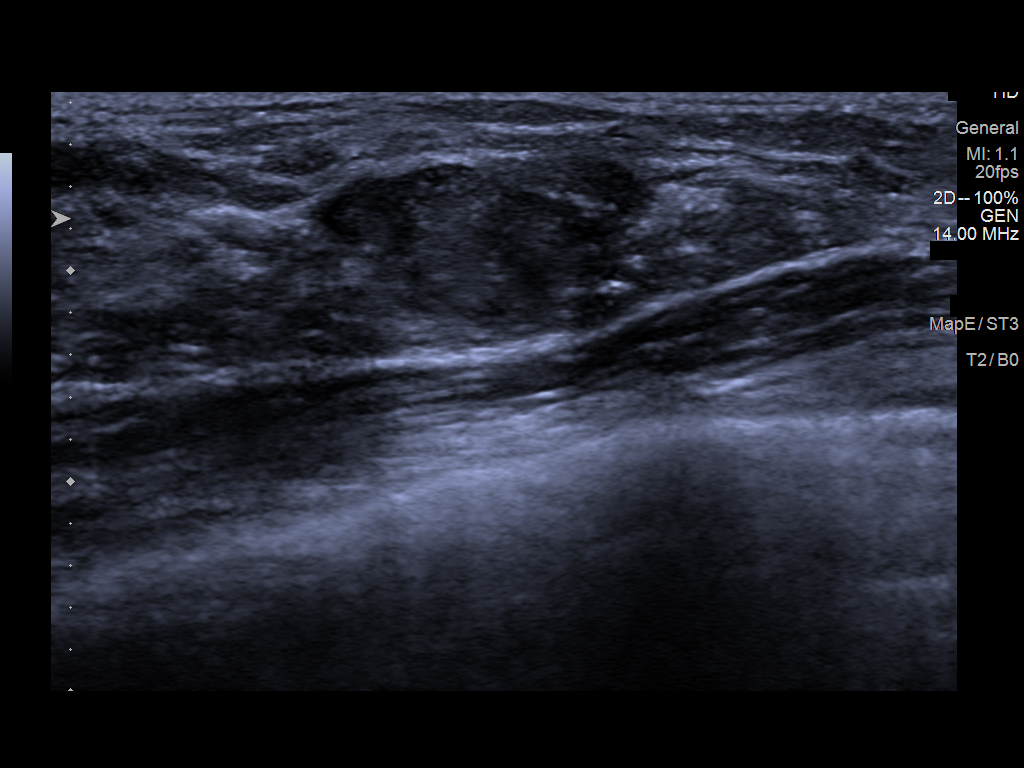
[im 3/7]
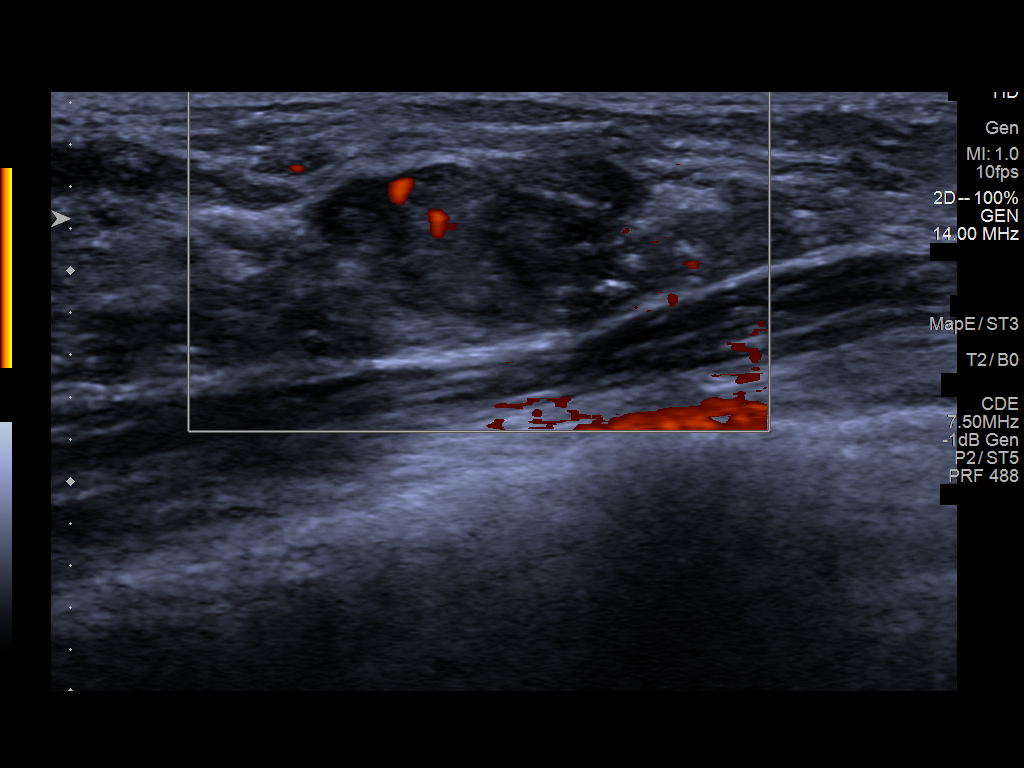
[im 4/7]
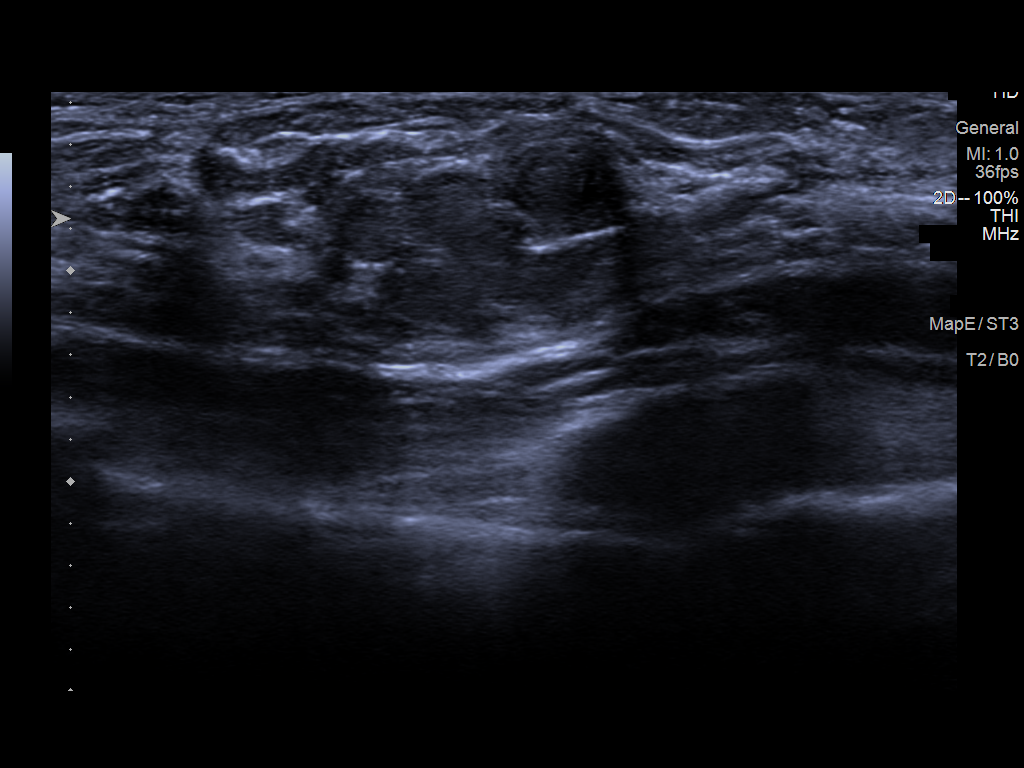
[im 5/7]
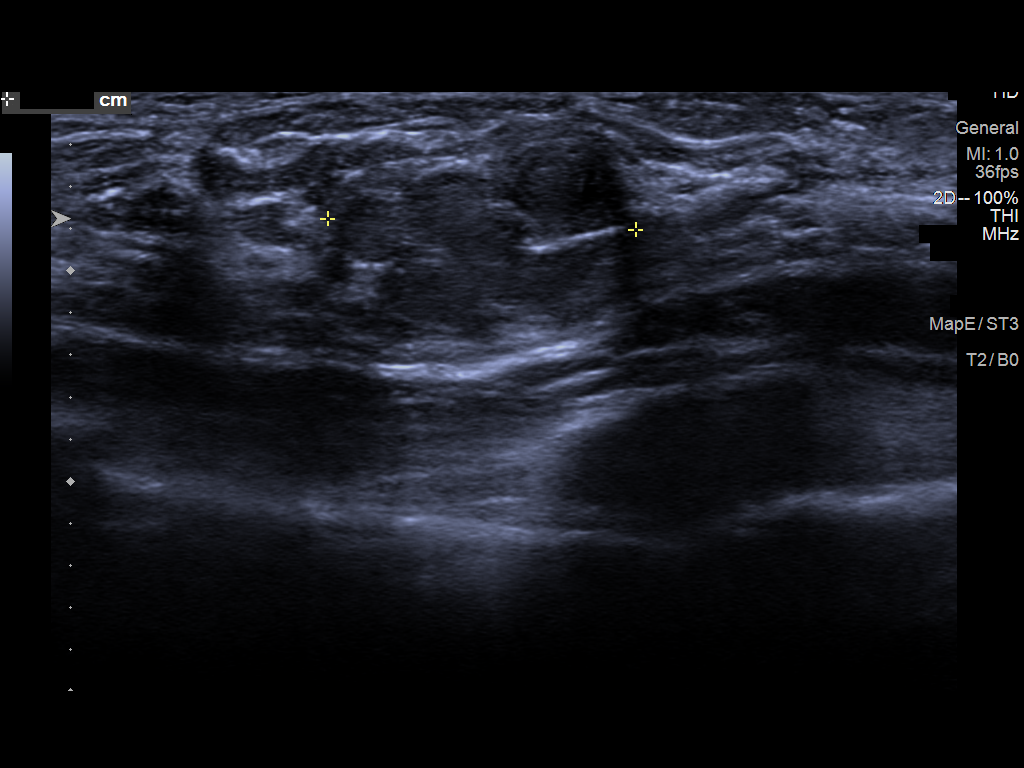
[im 6/7]
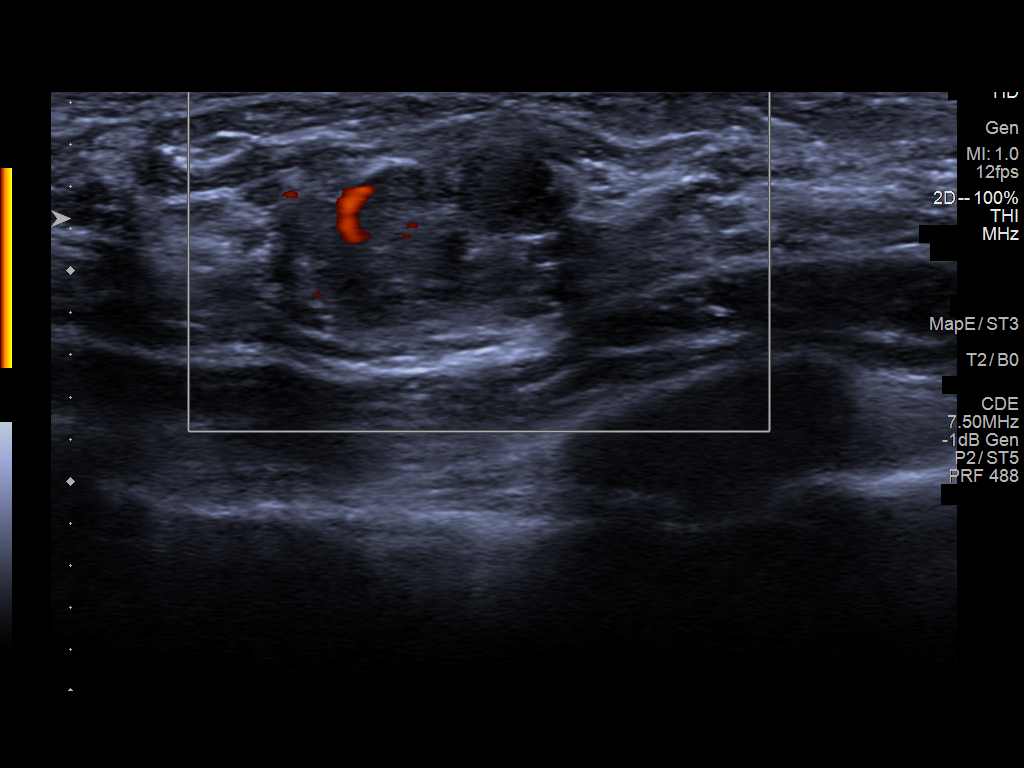
[im 7/7]
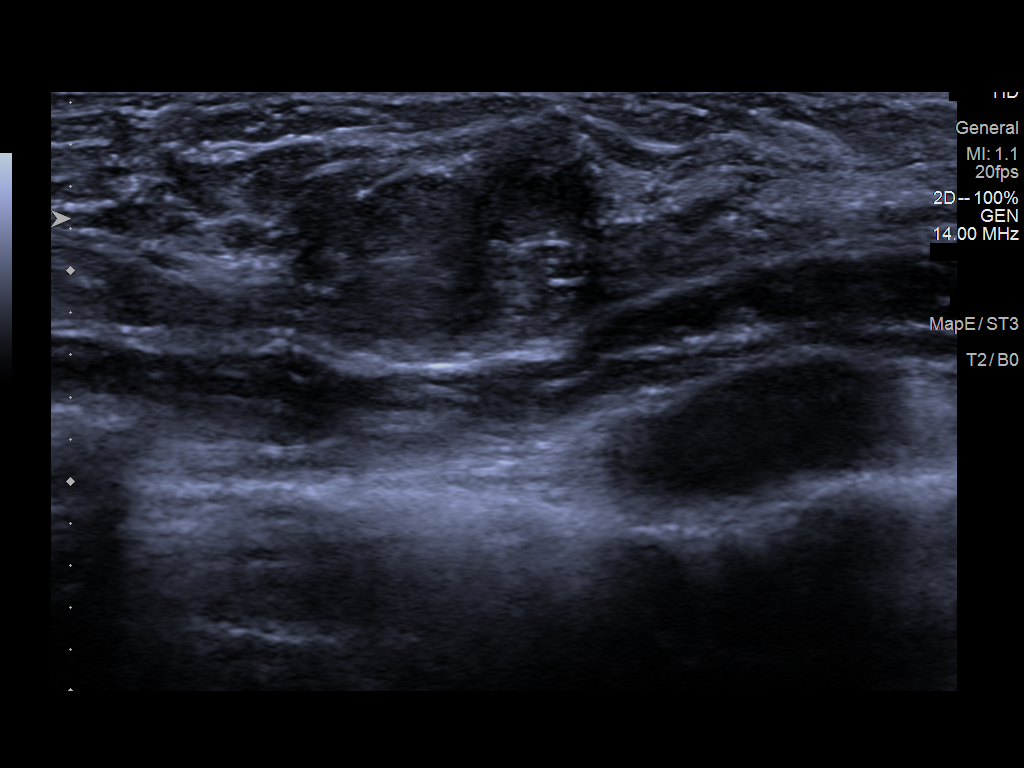

[7 of 7 positions shown; findings below may reference images not displayed]

FINDINGS: On physical exam, there is a smooth mobile mass in the [DATE] position
the right breast 3 cm from the nipple.

Targeted ultrasound is performed, showing a hypoechoic oval parallel
lobulated mass with internal echogenic bands [DATE] position 3 cm from
the nipple measuring 1.6 x 0.8 x 1.5 cm.
IMPRESSION: Probable fibroadenoma in the [DATE] position of the right breast.

RECOMMENDATION:
Right breast ultrasound is recommended in July 2019 to follow-up
a known mass in the right breast at [DATE] position first evaluated
in May 2017, and to follow-up the patient's recently discovered
mass at [DATE] position right breast.

I have discussed the findings and recommendations with the patient.
Results were also provided in writing at the conclusion of the
visit. If applicable, a reminder letter will be sent to the patient
regarding the next appointment.

BI-RADS CATEGORY  3: Probably benign.

## 2020-01-21 ENCOUNTER — Ambulatory Visit: Payer: 59 | Admitting: Obstetrics and Gynecology

## 2020-01-22 ENCOUNTER — Ambulatory Visit (INDEPENDENT_AMBULATORY_CARE_PROVIDER_SITE_OTHER): Payer: 59 | Admitting: Obstetrics and Gynecology

## 2020-01-22 ENCOUNTER — Other Ambulatory Visit (HOSPITAL_COMMUNITY)
Admission: RE | Admit: 2020-01-22 | Discharge: 2020-01-22 | Disposition: A | Discharge status: Home / Self Care | Payer: 59 | Source: Ambulatory Visit | Attending: Obstetrics and Gynecology | Admitting: Obstetrics and Gynecology

## 2020-01-22 ENCOUNTER — Encounter: Payer: Self-pay | Admitting: Obstetrics and Gynecology

## 2020-01-22 ENCOUNTER — Other Ambulatory Visit: Payer: Self-pay

## 2020-01-22 VITALS — BP 120/90 | Ht 65.0 in | Wt 106.0 lb

## 2020-01-22 DIAGNOSIS — Z01419 Encounter for gynecological examination (general) (routine) without abnormal findings: Secondary | ICD-10-CM | POA: Diagnosis not present

## 2020-01-22 DIAGNOSIS — D241 Benign neoplasm of right breast: Secondary | ICD-10-CM

## 2020-01-22 DIAGNOSIS — Z113 Encounter for screening for infections with a predominantly sexual mode of transmission: Secondary | ICD-10-CM | POA: Diagnosis not present

## 2020-01-22 DIAGNOSIS — Z3041 Encounter for surveillance of contraceptive pills: Secondary | ICD-10-CM

## 2020-01-22 NOTE — Patient Instructions (Addendum)
I value your feedback and entrusting us with your care. If you get a [facility] patient survey, I would appreciate you taking the time to let us know about your experience today. Thank you! ° °As of July 18, 2019, your lab results will be released to your MyChart immediately, before I even have a chance to see them. Please give me time to review them and contact you if there are any abnormalities. Thank you for your patience.  ° °[facility]: [phone] ° ° ° °

## 2020-01-22 NOTE — Progress Notes (Signed)
Chief Complaint  Patient presents with  . Gynecologic Exam     HPI:      [name] is a 24 y.o. G0P0000 who LMP was Patient's last menstrual period was 12/23/2019 (exact date)., presents today for her annual examination. Her menses are monthly, lasting 6-8 days, mod flow. No dysmen, BTB resolved with OCP change. Pt ok with duration of flow.   Sex activity: not currently; on OCPs. Last pap: 01/15/19  Results: normal Hx of STDs: none BV from 1/21 resolved after tx.  There is a FH of breast cancer in her mat grt aunt, genetic testing not indicated. There is no FH of ovarian cancer. The patient does do self-breast exams. Had RT breast mass 10/18 and u/s showed RT breast fibroadenoma, several stable repeat u/s since; last one 12/20. Area hasn't changed in size. Next u/s due 6/21. Noticed a new RT breast mass last yr. Had cat 3 breast u/s 6/20 and 12/20 and repeat due again 6/21. No change in size.    Tobacco use: The patient denies current or previous tobacco use. Alcohol use: social No drug use Exercise: very active  She does get adequate calcium and Vitamin D in her diet. Gardasil completed.    Past Medical History:  Diagnosis Date  . 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 Human papilloma virus (HPV) type 9 vaccine administered    series completed  . Migraines     History reviewed. No pertinent surgical history.  Family History  Problem Relation Age of Onset  . Hypertension Father   . Migraines Paternal Grandfather   . Alzheimer's disease Paternal Grandfather   . Hypertension Paternal Grandfather   . Breast cancer Other 60  . Alzheimer's disease Paternal Grandmother   . Hypertension Paternal Grandmother     Social History   Socioeconomic History  . Marital status: Single    Spouse name: Not on file  . Number of children: Not on file  . Years of education: Not on file  . Highest education level: Not on file  Occupational History  . Not on file    Tobacco Use  . Smoking status: Never Smoker  . Smokeless tobacco: Never Used  Vaping Use  . Vaping Use: Never used  Substance and Sexual Activity  . Alcohol use: No  . Drug use: No  . Sexual activity: Not Currently    Birth control/protection: Pill  Other Topics Concern  . Not on file  Social History Narrative  . Not on file   Social Determinants of Health   Financial Resource Strain:   . Difficulty of Paying Living Expenses:   Food Insecurity:   . Worried About Charity fundraiser in the Last Year:   . Arboriculturist in the Last Year:   Transportation Needs:   . Film/video editor (Medical):   Marland Kitchen Lack of Transportation (Non-Medical):   Physical Activity:   . Days of Exercise per Week:   . Minutes of Exercise per Session:   Stress:   . Feeling of Stress :   Social Connections:   . Frequency of Communication with Friends and Family:   . Frequency of Social Gatherings with Friends and Family:   . Attends Religious Services:   . Active Member of Clubs or Organizations:   . Attends Archivist Meetings:   Marland Kitchen Marital Status:   Intimate Partner Violence:   . Fear of Current or Ex-Partner:   . Emotionally Abused:   .  Physically Abused:   . Sexually Abused:     Current Outpatient Medications on File Prior to Visit  Medication Sig Dispense Refill  . norgestimate-ethinyl estradiol (MONO-LINYAH) 0.25-35 MG-MCG tablet Take 1 tablet by mouth daily. 84 tablet 3   No current facility-administered medications on file prior to visit.    ROS:  Review of Systems  Constitutional: Negative for fatigue, fever and unexpected weight change.  Respiratory: Negative for cough, shortness of breath and wheezing.   Cardiovascular: Negative for chest pain, palpitations and leg swelling.  Gastrointestinal: Negative for blood in stool, constipation, diarrhea, nausea and vomiting.  Endocrine: Negative for cold intolerance, heat intolerance and polyuria.  Genitourinary: Negative  for dyspareunia, dysuria, flank pain, frequency, genital sores, hematuria, menstrual problem, pelvic pain, urgency, vaginal bleeding, vaginal discharge and vaginal pain.  Musculoskeletal: Negative for back pain, joint swelling and myalgias.  Skin: Negative for rash.  Neurological: Negative for dizziness, syncope, light-headedness, numbness and headaches.  Hematological: Negative for adenopathy.  Psychiatric/Behavioral: Negative for agitation, confusion, sleep disturbance and suicidal ideas. The patient is not nervous/anxious.   BREAST: breast tenderness and mass   Objective: BP 120/90   Ht 5\' 5"  (1.651 m)   Wt 106 lb (48.1 kg)   LMP 12/23/2019 (Exact Date)   BMI 17.64 kg/m    Physical Exam Constitutional:      Appearance: She is well-developed.  Genitourinary:     Vulva, vagina, uterus, right adnexa and left adnexa normal.     No vulval lesion or tenderness noted.     No vaginal discharge, erythema or tenderness.     No cervical motion tenderness or polyp.     Uterus is not enlarged or tender.     No right or left adnexal mass present.     Right adnexa not tender.     Left adnexa not tender.  Neck:     Thyroid: No thyromegaly.  Cardiovascular:     Rate and Rhythm: Normal rate and regular rhythm.     Heart sounds: Normal heart sounds. No murmur heard.   Pulmonary:     Effort: Pulmonary effort is normal.     Breath sounds: Normal breath sounds.  Chest:     Breasts:        Right: Mass present. No nipple discharge, skin change or tenderness.        Left: No mass, nipple discharge, skin change or tenderness.    Abdominal:     Palpations: Abdomen is soft.     Tenderness: There is no abdominal tenderness. There is no guarding.  Musculoskeletal:        General: Normal range of motion.     Cervical back: Normal range of motion.  Neurological:     General: No focal deficit present.     Mental Status: She is alert and oriented to person, place, and time.     Cranial Nerves:  No cranial nerve deficit.  Skin:    General: Skin is warm and dry.  Psychiatric:        Mood and Affect: Mood normal.        Behavior: Behavior normal.        Thought Content: Thought content normal.        Judgment: Judgment normal.  Vitals reviewed.     Assessment/Plan: Encounter for annual routine gynecological examination  Screening for STD (sexually transmitted disease) - Plan: Cervicovaginal ancillary only  Encounter for surveillance of contraceptive pills--Rx RF already sent for a yr 5/21  Fibroadenoma of breast, right--x 2. Repeat breast u/s due. Pt to sched, order already placed.             GYN counsel breast self exam, adequate intake of calcium and vitamin D, diet and exercise     F/U  Return in about 1 year (around 01/21/2021).  Shardai Star B. Maliha Outten, PA-C 01/22/2020 2:13 PM

## 2020-01-24 LAB — CERVICOVAGINAL ANCILLARY ONLY
Chlamydia: NEGATIVE
Comment: NEGATIVE
Comment: NORMAL
Neisseria Gonorrhea: NEGATIVE

## 2020-03-17 ENCOUNTER — Telehealth: Payer: Self-pay

## 2020-03-17 NOTE — Telephone Encounter (Signed)
Patient works for [employer] and has a Probation officer form she needs completed. Inquiring if [facility] will do since she was just seen in June. [phone]

## 2020-03-18 NOTE — Telephone Encounter (Signed)
Spoke w/patient. Advised [facility] out of office today, however, patient can drop form off at the front desk or fax to [place]. Fax # provided.

## 2020-04-08 ENCOUNTER — Encounter: Payer: Self-pay | Admitting: Obstetrics and Gynecology

## 2020-04-08 ENCOUNTER — Ambulatory Visit (INDEPENDENT_AMBULATORY_CARE_PROVIDER_SITE_OTHER): Payer: 59 | Admitting: Obstetrics and Gynecology

## 2020-04-08 ENCOUNTER — Other Ambulatory Visit: Payer: Self-pay

## 2020-04-08 VITALS — BP 120/90 | Ht 65.0 in | Wt 106.0 lb

## 2020-04-08 DIAGNOSIS — Z111 Encounter for screening for respiratory tuberculosis: Secondary | ICD-10-CM | POA: Diagnosis not present

## 2020-04-08 DIAGNOSIS — B373 Candidiasis of vulva and vagina: Secondary | ICD-10-CM | POA: Diagnosis not present

## 2020-04-08 DIAGNOSIS — B3731 Acute candidiasis of vulva and vagina: Secondary | ICD-10-CM

## 2020-04-08 DIAGNOSIS — R350 Frequency of micturition: Secondary | ICD-10-CM | POA: Diagnosis not present

## 2020-04-08 LAB — POCT URINALYSIS DIPSTICK
Bilirubin, UA: NEGATIVE
Blood, UA: NEGATIVE
Glucose, UA: NEGATIVE
Ketones, UA: NEGATIVE
Leukocytes, UA: NEGATIVE
Nitrite, UA: NEGATIVE
Protein, UA: NEGATIVE
Spec Grav, UA: 1.015 (ref 1.010–1.025)
pH, UA: 6 (ref 5.0–8.0)

## 2020-04-08 LAB — POCT WET PREP WITH KOH
Clue Cells Wet Prep HPF POC: NEGATIVE
KOH Prep POC: NEGATIVE
Trichomonas, UA: NEGATIVE
Yeast Wet Prep HPF POC: POSITIVE

## 2020-04-08 MED ORDER — FLUCONAZOLE 150 MG PO TABS: 150.0000 mg | ORAL_TABLET | Freq: Once | ORAL | 0 refills | Status: AC

## 2020-04-08 NOTE — Progress Notes (Signed)
Tina Peterson, Tina Evener, PA-C   Chief Complaint  Patient presents with  . Vaginal Discharge    sour odor, had some itchiness x 1 week and a half  . Urinary Tract Infection    frequency urinating, no burning x 1 week and a half    HPI:      [name] is a 24 y.o. G0P0000 whose LMP was Patient's last menstrual period was 03/11/2020 (approximate)., presents today for increased vag d/c with non-fishy odor, and irritation for past 1 1/2 wks. Sx started after being in wet bathing suit all day. No meds to treat. Pt also with urinary frequency/urgency and dysuria for 1 1/2 wks. No hematuria, LBP, pelvic pain, fevers. Hx of UTIs. Has taken AZO with some relief. Drinks a lot of caffeine daily She is not sex active, Neg STD testing 6/21  Past Medical History:  Diagnosis Date  . 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 Human papilloma virus (HPV) type 9 vaccine administered    series completed  . Migraines     History reviewed. No pertinent surgical history.  Family History  Problem Relation Age of Onset  . Hypertension Father   . Migraines Paternal Grandfather   . Alzheimer's disease Paternal Grandfather   . Hypertension Paternal Grandfather   . Breast cancer Other 60  . Alzheimer's disease Paternal Grandmother   . Hypertension Paternal Grandmother     Social History   Socioeconomic History  . Marital status: Single    Spouse name: Not on file  . Number of children: Not on file  . Years of education: Not on file  . Highest education level: Not on file  Occupational History  . Not on file  Tobacco Use  . Smoking status: Never Smoker  . Smokeless tobacco: Never Used  Vaping Use  . Vaping Use: Never used  Substance and Sexual Activity  . Alcohol use: No  . Drug use: No  . Sexual activity: Not Currently    Birth control/protection: Pill  Other Topics Concern  . Not on file  Social History Narrative  . Not on file   Social Determinants of Health   Financial  Resource Strain:   . Difficulty of Paying Living Expenses: Not on file  Food Insecurity:   . Worried About Charity fundraiser in the Last Year: Not on file  . Ran Out of Food in the Last Year: Not on file  Transportation Needs:   . Lack of Transportation (Medical): Not on file  . Lack of Transportation (Non-Medical): Not on file  Physical Activity:   . Days of Exercise per Week: Not on file  . Minutes of Exercise per Session: Not on file  Stress:   . Feeling of Stress : Not on file  Social Connections:   . Frequency of Communication with Friends and Family: Not on file  . Frequency of Social Gatherings with Friends and Family: Not on file  . Attends Religious Services: Not on file  . Active Member of Clubs or Organizations: Not on file  . Attends Archivist Meetings: Not on file  . Marital Status: Not on file  Intimate Partner Violence:   . Fear of Current or Ex-Partner: Not on file  . Emotionally Abused: Not on file  . Physically Abused: Not on file  . Sexually Abused: Not on file    Outpatient Medications Prior to Visit  Medication Sig Dispense Refill  . norgestimate-ethinyl estradiol (MONO-LINYAH) 0.25-35 MG-MCG tablet  Take 1 tablet by mouth daily. 84 tablet 3   No facility-administered medications prior to visit.      ROS:  Review of Systems  Constitutional: Negative for fever.  Gastrointestinal: Negative for blood in stool, constipation, diarrhea, nausea and vomiting.  Genitourinary: Positive for dysuria, frequency, urgency and vaginal discharge. Negative for dyspareunia, flank pain, hematuria, vaginal bleeding and vaginal pain.  Musculoskeletal: Negative for back pain.  Skin: Negative for rash.    OBJECTIVE:   Vitals:  BP 120/90   Ht 5\' 5"  (1.651 m)   Wt 106 lb (48.1 kg)   LMP 03/11/2020 (Approximate)   BMI 17.64 kg/m   Physical Exam Vitals reviewed.  Constitutional:      Appearance: She is well-developed.  Pulmonary:     Effort: Pulmonary  effort is normal.  Genitourinary:    General: Normal vulva.     Pubic Area: No rash.      Labia:        Right: No rash, tenderness or lesion.        Left: No rash, tenderness or lesion.      Vagina: Vaginal discharge present. No erythema or tenderness.     Cervix: Normal.     Uterus: Normal. Not enlarged and not tender.      Adnexa: Right adnexa normal and left adnexa normal.       Right: No mass or tenderness.         Left: No mass or tenderness.    Musculoskeletal:        General: Normal range of motion.     Cervical back: Normal range of motion.  Skin:    General: Skin is warm and dry.  Neurological:     General: No focal deficit present.     Mental Status: She is alert and oriented to person, place, and time.  Psychiatric:        Mood and Affect: Mood normal.        Behavior: Behavior normal.        Thought Content: Thought content normal.        Judgment: Judgment normal.     Results: Results for orders placed or performed in visit on 04/08/20 (from the past 24 hour(s))  POCT Urinalysis Dipstick     Status: Normal   Collection Time: 04/08/20  4:37 PM  Result Value Ref Range   Color, UA yellow    Clarity, UA clear    Glucose, UA Negative Negative   Bilirubin, UA neg    Ketones, UA neg    Spec Grav, UA 1.015 1.010 - 1.025   Blood, UA neg    pH, UA 6.0 5.0 - 8.0   Protein, UA Negative Negative   Urobilinogen, UA     Nitrite, UA neg    Leukocytes, UA Negative Negative   Appearance     Odor    POCT Wet Prep with KOH     Status: Abnormal   Collection Time: 04/08/20  4:38 PM  Result Value Ref Range   Trichomonas, UA Negative    Clue Cells Wet Prep HPF POC neg    Epithelial Wet Prep HPF POC     Yeast Wet Prep HPF POC pos    Bacteria Wet Prep HPF POC     RBC Wet Prep HPF POC     WBC Wet Prep HPF POC     KOH Prep POC Negative Negative     Assessment/Plan: Candidal vaginitis - Plan: fluconazole (DIFLUCAN) 150 MG  tablet, POCT Wet Prep with KOH; Pos sx and wet  prep. Rx diflucan. F/u prn.   Urinary frequency - Plan: POCT Urinalysis Dipstick, Urine Culture; Pos sx, neg UA. Check C&S due to hx of UTI. If neg, d/c caffeine and f/u prn.   Screening for tuberculosis - Plan: Quantiferon tb gold assay (blood); [employer] school health form completed.    Meds ordered this encounter  Medications  . fluconazole (DIFLUCAN) 150 MG tablet    Sig: Take 1 tablet (150 mg total) by mouth once for 1 dose.    Dispense:  1 tablet    Refill:  0    Order Specific Question:   Supervising Provider    Answer:   Gae Dry [[id number]]      Return if symptoms worsen or fail to improve.  Mayeli Bornhorst B. Jhamal Plucinski, PA-C 04/08/2020 4:39 PM

## 2020-04-08 NOTE — Patient Instructions (Signed)
I value your feedback and entrusting us with your care. If you get a [facility] patient survey, I would appreciate you taking the time to let us know about your experience today. Thank you!  As of July 18, 2019, your lab results will be released to your MyChart immediately, before I even have a chance to see them. Please give me time to review them and contact you if there are any abnormalities. Thank you for your patience.  

## 2020-04-13 LAB — URINE CULTURE

## 2020-04-17 ENCOUNTER — Other Ambulatory Visit: Payer: Self-pay | Admitting: Obstetrics and Gynecology

## 2020-04-17 ENCOUNTER — Other Ambulatory Visit: Payer: Self-pay

## 2020-04-17 ENCOUNTER — Other Ambulatory Visit: Payer: 59

## 2020-04-17 DIAGNOSIS — Z111 Encounter for screening for respiratory tuberculosis: Secondary | ICD-10-CM

## 2020-04-20 LAB — QUANTIFERON-TB GOLD PLUS
QuantiFERON Mitogen Value: >10 IU/mL
QuantiFERON Nil Value: 0.04 IU/mL
QuantiFERON TB1 Ag Value: 0.04 IU/mL
QuantiFERON TB2 Ag Value: 0.04 IU/mL
QuantiFERON-TB Gold Plus: NEGATIVE

## 2020-07-05 IMAGING — US US BREAST*R* LIMITED INC AXILLA
1 series · 10 of 10 positions shown · non-contrast
Comparison: Previous exam(s).

CLINICAL DATA: 23-year-old female presenting for follow-up of 2
likely benign masses in the right breast.

EXAM:
ULTRASOUND OF THE RIGHT BREAST

[Series 1: us breast*right* limited inc axilla · 0.04mm/px · 10 of 10 slices shown]
[im 1/10]
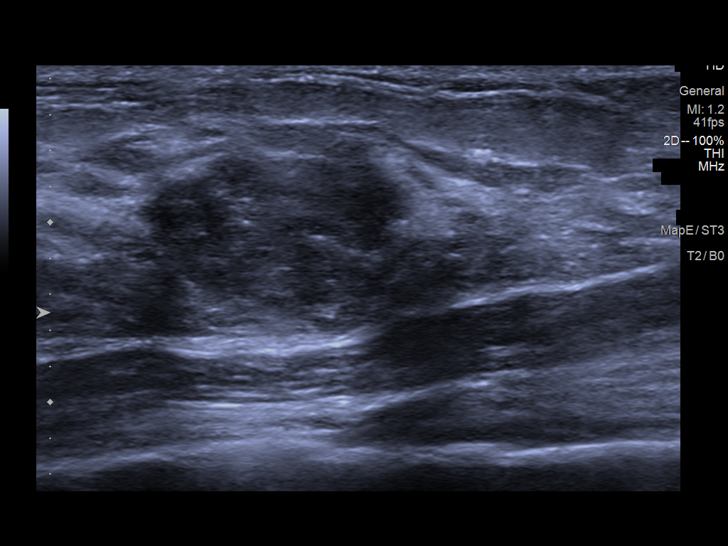
[im 2/10]
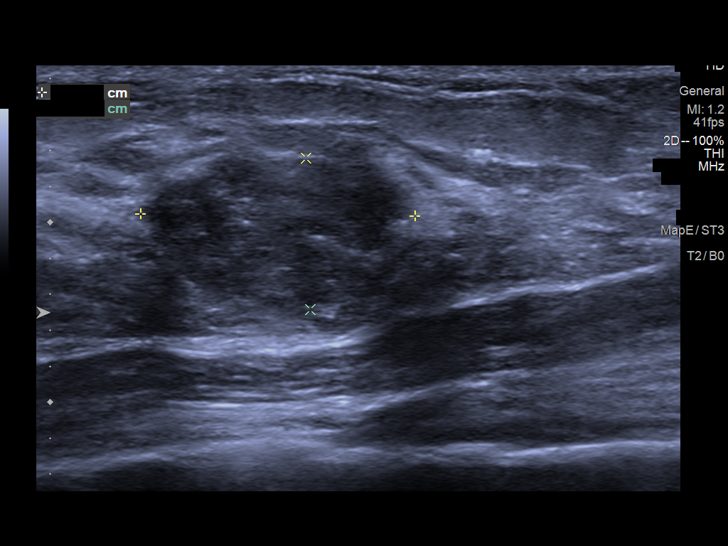
[im 3/10]
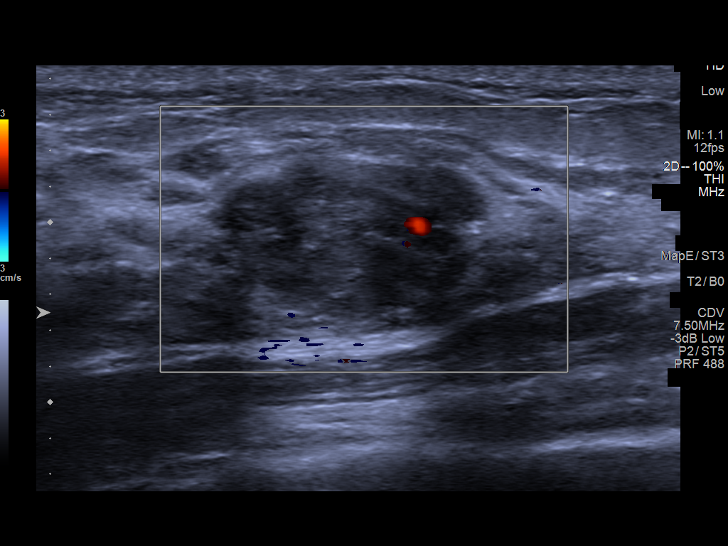
[im 4/10]
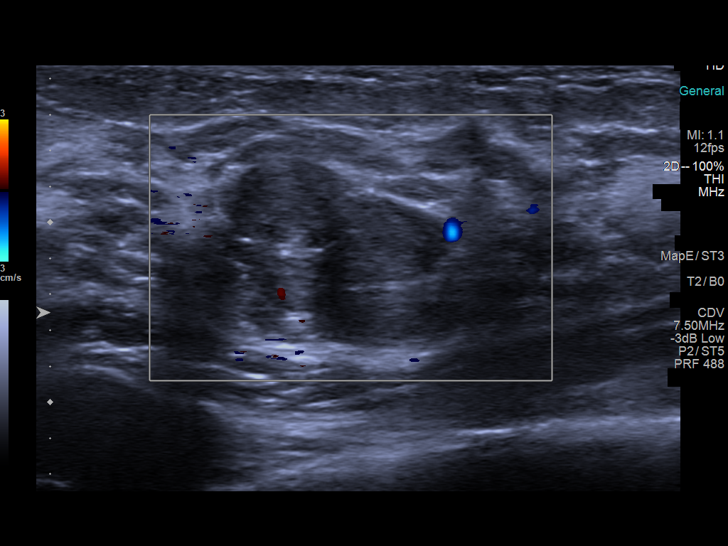
[im 5/10]
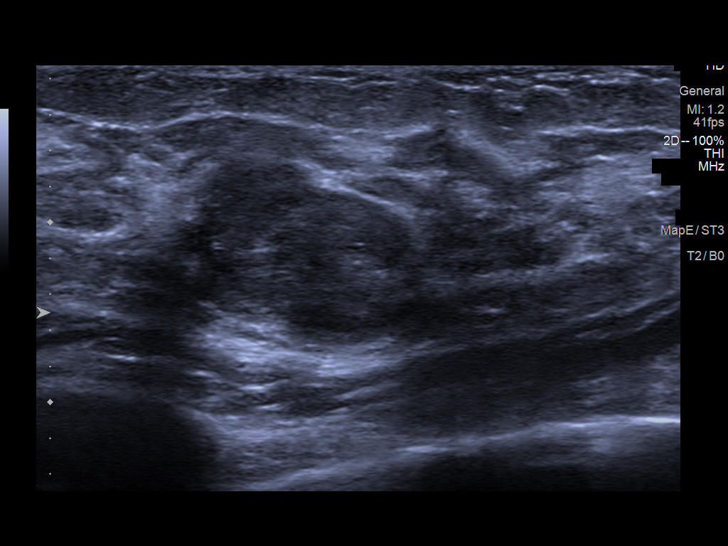
[im 6/10]
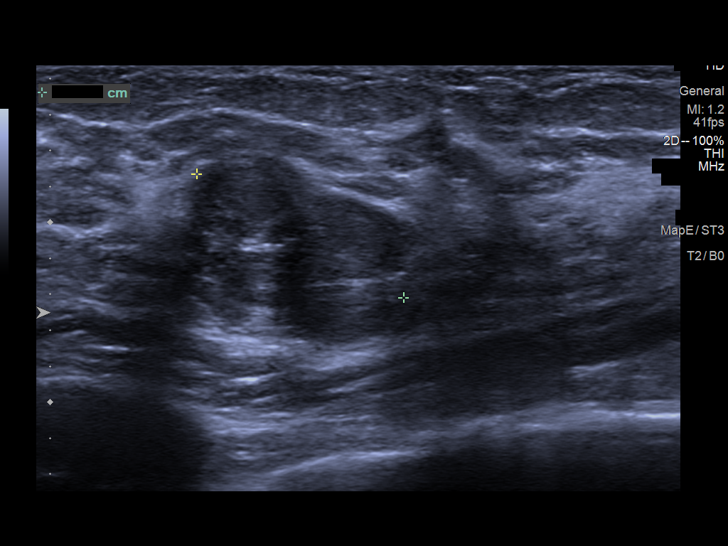
[im 7/10]
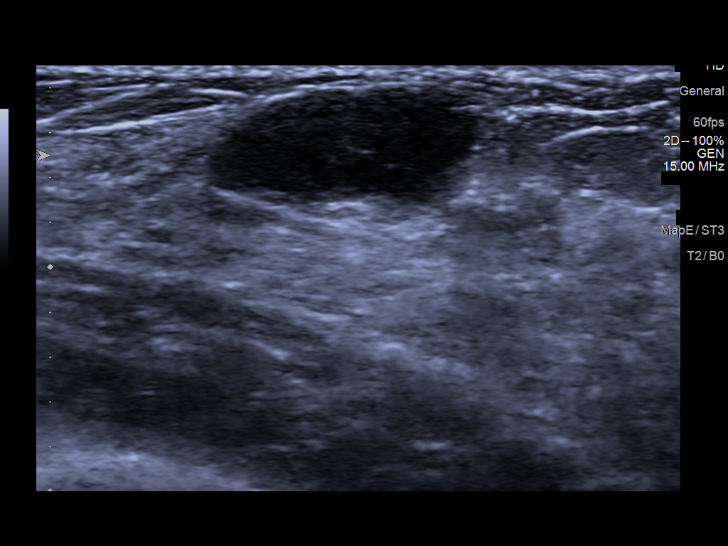
[im 8/10]
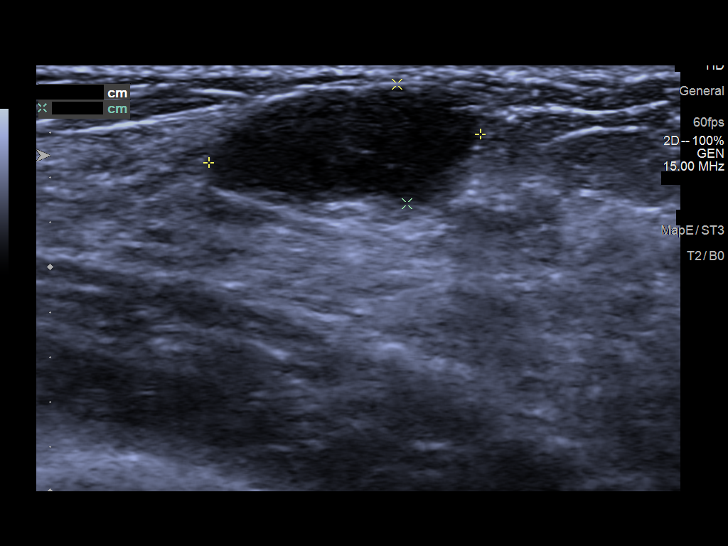
[im 9/10]
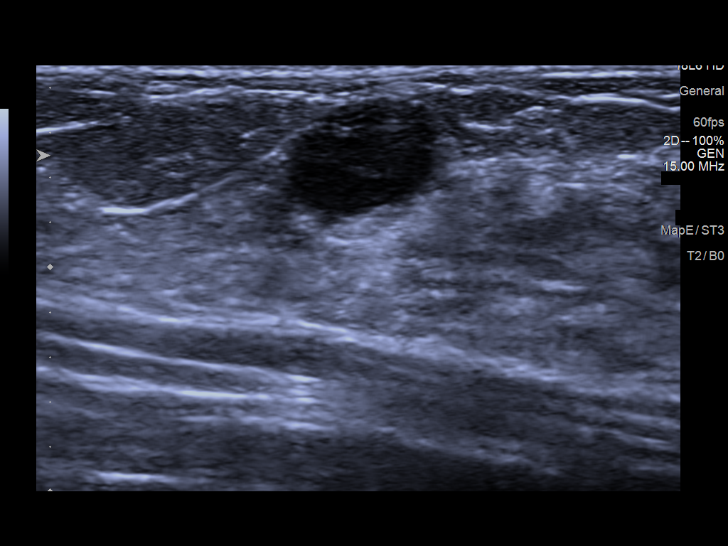
[im 10/10]
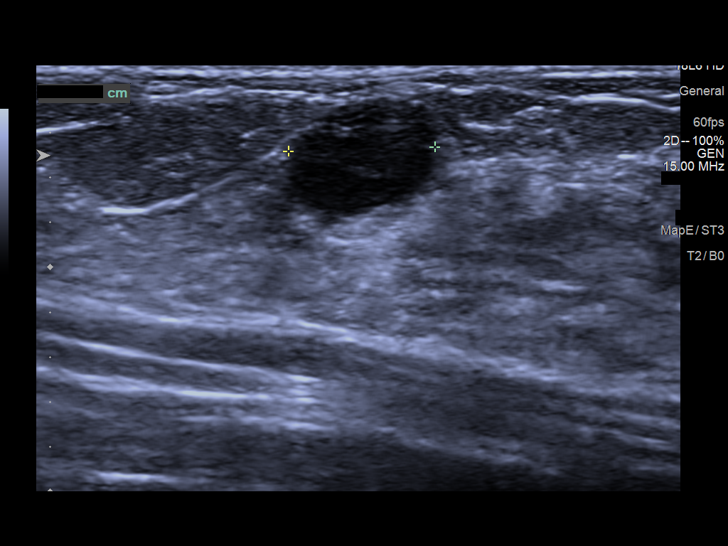

[10 of 10 positions shown; findings below may reference images not displayed]

FINDINGS: Ultrasound of the right breast at [DATE], 4 cm from the nipple
demonstrates a stable mass measuring 1.2 x 0.5 x 0.7 cm, previously
measuring 1.4 x 0.5 x 0.7 cm in Monday May, 2017.

Ultrasound of the right breast at 330, 3 cm from the nipple
demonstrates a stable mass measuring 1.5 x 0.8 x 1.3 cm, previously
measuring 1.6 x 0.8 x 1.5 cm on 07/12/2018.
IMPRESSION: 1. The right breast mass at [DATE] has demonstrated 2 years of
stability, and is therefore benign.

2. The likely benign right breast mass at [DATE] has demonstrated 6
months of stability.

RECOMMENDATION:
Six-month follow-up right breast ultrasound for the right breast
mass at [DATE]. No further follow-up is necessary for the mass at

I have discussed the findings and recommendations with the patient.
If applicable, a reminder letter will be sent to the patient
regarding the next appointment.

BI-RADS CATEGORY  3: Probably benign.

## 2020-07-28 ENCOUNTER — Ambulatory Visit
Admission: RE | Admit: 2020-07-28 | Discharge: 2020-07-28 | Disposition: A | Discharge status: Home / Self Care | Payer: 59 | Source: Ambulatory Visit | Attending: Obstetrics and Gynecology | Admitting: Obstetrics and Gynecology

## 2020-07-28 ENCOUNTER — Other Ambulatory Visit: Payer: Self-pay

## 2020-07-28 DIAGNOSIS — R928 Other abnormal and inconclusive findings on diagnostic imaging of breast: Secondary | ICD-10-CM | POA: Insufficient documentation

## 2020-07-28 DIAGNOSIS — N6310 Unspecified lump in the right breast, unspecified quadrant: Secondary | ICD-10-CM | POA: Diagnosis not present

## 2020-07-28 DIAGNOSIS — N6314 Unspecified lump in the right breast, lower inner quadrant: Secondary | ICD-10-CM | POA: Diagnosis not present

## 2020-11-19 ENCOUNTER — Other Ambulatory Visit: Payer: Self-pay

## 2020-11-19 DIAGNOSIS — Z3041 Encounter for surveillance of contraceptive pills: Secondary | ICD-10-CM

## 2020-11-19 MED ORDER — NORGESTIMATE-ETH ESTRADIOL 0.25-35 MG-MCG PO TABS
1.0000 | ORAL_TABLET | Freq: Every day | ORAL | 0 refills | Status: DC
  Filled 2020-11-19: qty 84, 84d supply, fill #0

## 2020-11-19 NOTE — Telephone Encounter (Signed)
Pt calling; has scheduled appt for 01/25/21; needs refill.  [phone]  Pt aware refill eRx'd.

## 2021-01-14 NOTE — Progress Notes (Signed)
Chief Complaint  Patient presents with   Gynecologic Exam    No concerns     HPI:      [name] is a 25 y.o. G0P0000 who LMP was Patient's last menstrual period was 01/19/2021 (exact date)., presents today for her annual examination. Her menses are monthly, lasting 6-8 days, light to mod flow. No dysmen, BTB resolved with OCP change. Pt ok with duration of flow.    Sex activity: not currently (not since last yr); on OCPs. Last pap: 01/15/19  Results: normal Hx of STDs: none; testing not indicated this yr since not active since last neg test   There is a FH of breast cancer in her mat grt aunt, genetic testing not indicated. There is no FH of ovarian cancer. The patient does do self-breast exams. Had RT breast mass 10/18 and u/s showed RT breast fibroadenoma, several stable repeat u/s since; last one 12/21. Area hasn't changed in size. Next u/s due 12/22. Noticed a new RT breast mass last yr. Had cat 3 breast u/s 6/20 and 12/20 and repeat due again 6/21. No change in size.   Tobacco use: The patient denies current or previous tobacco use. Alcohol use: social No drug use Exercise: very active   She does get adequate calcium and Vitamin D in her diet. Gardasil completed.    Past Medical History:  Diagnosis Date   Fibroadenoma of breast, right 05/2017   Human papilloma virus (HPV) type 9 vaccine administered    series completed   Migraines     History reviewed. No pertinent surgical history.  Family History  Problem Relation Age of Onset   Hypertension Father    Migraines Paternal Grandfather    Alzheimer's disease Paternal Grandfather    Hypertension Paternal Grandfather    Breast cancer Other 34   Alzheimer's disease Paternal Grandmother    Hypertension Paternal Grandmother     Social History   Socioeconomic History   Marital status: Single    Spouse name: Not on file   Number of children: Not on file   Years of education: Not on file   Highest education  level: Not on file  Occupational History   Not on file  Tobacco Use   Smoking status: Never   Smokeless tobacco: Never  Vaping Use   Vaping Use: Never used  Substance and Sexual Activity   Alcohol use: No   Drug use: No   Sexual activity: Not Currently    Birth control/protection: Pill  Other Topics Concern   Not on file  Social History Narrative   Not on file   Social Determinants of Health   Financial Resource Strain: Not on file  Food Insecurity: Not on file  Transportation Needs: Not on file  Physical Activity: Not on file  Stress: Not on file  Social Connections: Not on file  Intimate Partner Violence: Not on file    No current outpatient medications on file prior to visit.   No current facility-administered medications on file prior to visit.    ROS:  Review of Systems  Constitutional:  Negative for fatigue, fever and unexpected weight change.  Respiratory:  Negative for cough, shortness of breath and wheezing.   Cardiovascular:  Negative for chest pain, palpitations and leg swelling.  Gastrointestinal:  Negative for blood in stool, constipation, diarrhea, nausea and vomiting.  Endocrine: Negative for cold intolerance, heat intolerance and polyuria.  Genitourinary:  Negative for dyspareunia, dysuria, flank pain, frequency, genital sores, hematuria,  menstrual problem, pelvic pain, urgency, vaginal bleeding, vaginal discharge and vaginal pain.  Musculoskeletal:  Negative for back pain, joint swelling and myalgias.  Skin:  Negative for rash.  Neurological:  Negative for dizziness, syncope, light-headedness, numbness and headaches.  Hematological:  Negative for adenopathy.  Psychiatric/Behavioral:  Negative for agitation, confusion, sleep disturbance and suicidal ideas. The patient is not nervous/anxious.  BREAST: breast tenderness and mass   Objective: BP 100/60   Ht 5\' 5"  (1.651 m)   Wt 107 lb (48.5 kg)   LMP 01/19/2021 (Exact Date)   BMI 17.81 kg/m     Physical Exam Constitutional:      Appearance: She is well-developed.  Genitourinary:     Vulva normal.     No vaginal discharge, erythema or tenderness.      Right Adnexa: not tender and no mass present.    Left Adnexa: not tender and no mass present.    No cervical motion tenderness or polyp.     Uterus is not enlarged or tender.  Breasts:    Right: Mass present. No nipple discharge, skin change or tenderness.     Left: No mass, nipple discharge, skin change or tenderness.  Neck:     Thyroid: No thyromegaly.  Cardiovascular:     Rate and Rhythm: Normal rate and regular rhythm.     Heart sounds: Normal heart sounds. No murmur heard. Pulmonary:     Effort: Pulmonary effort is normal.     Breath sounds: Normal breath sounds.  Chest:    Abdominal:     Palpations: Abdomen is soft.     Tenderness: There is no abdominal tenderness. There is no guarding.  Musculoskeletal:        General: Normal range of motion.     Cervical back: Normal range of motion.  Neurological:     General: No focal deficit present.     Mental Status: She is alert and oriented to person, place, and time.     Cranial Nerves: No cranial nerve deficit.  Skin:    General: Skin is warm and dry.  Psychiatric:        Mood and Affect: Mood normal.        Behavior: Behavior normal.        Thought Content: Thought content normal.        Judgment: Judgment normal.  Vitals reviewed.    Assessment/Plan: Encounter for annual routine gynecological examination  Encounter for surveillance of contraceptive pills - Plan: norgestimate-ethinyl estradiol (ORTHO-CYCLEN) 0.25-35 MG-MCG tablet; OCP RF  Fibroadenoma of breast, right - Plan: US BREAST LTD UNI RIGHT INC AXILLA; pt to sched RT breast f/u u/s 12/22.  Meds ordered this encounter  Medications   norgestimate-ethinyl estradiol (ORTHO-CYCLEN) 0.25-35 MG-MCG tablet    Sig: TAKE 1 TABLET BY MOUTH DAILY.    Dispense:  84 tablet    Refill:  3    Order  Specific Question:   Supervising Provider    Answer:   Gae Dry [[id number]]              GYN counsel breast self exam, adequate intake of calcium and vitamin D, diet and exercise     F/U  Return in about 1 year (around 01/25/2022).  Keagen Heinlen B. Elisandro Jarrett, PA-C 01/25/2021 11:29 AM

## 2021-01-25 ENCOUNTER — Other Ambulatory Visit: Payer: Self-pay

## 2021-01-25 ENCOUNTER — Ambulatory Visit (INDEPENDENT_AMBULATORY_CARE_PROVIDER_SITE_OTHER): Payer: 59 | Admitting: Obstetrics and Gynecology

## 2021-01-25 ENCOUNTER — Encounter: Payer: Self-pay | Admitting: Obstetrics and Gynecology

## 2021-01-25 VITALS — BP 100/60 | Ht 65.0 in | Wt 107.0 lb

## 2021-01-25 DIAGNOSIS — D241 Benign neoplasm of right breast: Secondary | ICD-10-CM

## 2021-01-25 DIAGNOSIS — Z01419 Encounter for gynecological examination (general) (routine) without abnormal findings: Secondary | ICD-10-CM | POA: Diagnosis not present

## 2021-01-25 DIAGNOSIS — Z3041 Encounter for surveillance of contraceptive pills: Secondary | ICD-10-CM

## 2021-01-25 MED ORDER — NORGESTIMATE-ETH ESTRADIOL 0.25-35 MG-MCG PO TABS
1.0000 | ORAL_TABLET | Freq: Every day | ORAL | 3 refills | Status: DC
  Filled 2021-01-25: qty 84, 84d supply, fill #0
  Filled 2021-02-15 – 2021-05-13 (×2): qty 84, 84d supply, fill #1
  Filled 2021-08-05: qty 84, 84d supply, fill #2
  Filled 2021-10-28: qty 84, 84d supply, fill #3

## 2021-01-25 NOTE — Patient Instructions (Signed)
I value your feedback and you entrusting us with your care. If you get a [facility] patient survey, I would appreciate you taking the time to let us know about your experience today. Thank you!  [facility]: [phone]      

## 2021-01-26 ENCOUNTER — Other Ambulatory Visit: Payer: Self-pay

## 2021-02-01 ENCOUNTER — Other Ambulatory Visit: Payer: Self-pay

## 2021-02-15 ENCOUNTER — Other Ambulatory Visit: Payer: Self-pay

## 2021-02-18 ENCOUNTER — Other Ambulatory Visit: Payer: Self-pay

## 2021-02-18 MED ORDER — AMOXICILLIN 500 MG PO CAPS
500.0000 mg | ORAL_CAPSULE | Freq: Four times a day (QID) | ORAL | 0 refills | Status: AC
  Filled 2021-02-18: qty 30, 8d supply, fill #0

## 2021-05-13 ENCOUNTER — Other Ambulatory Visit: Payer: Self-pay

## 2021-05-14 ENCOUNTER — Other Ambulatory Visit: Payer: Self-pay

## 2021-06-16 ENCOUNTER — Other Ambulatory Visit: Payer: Self-pay

## 2021-06-16 ENCOUNTER — Ambulatory Visit (INDEPENDENT_AMBULATORY_CARE_PROVIDER_SITE_OTHER): Payer: 59 | Admitting: Obstetrics and Gynecology

## 2021-06-16 ENCOUNTER — Other Ambulatory Visit (HOSPITAL_COMMUNITY)
Admission: RE | Admit: 2021-06-16 | Discharge: 2021-06-16 | Disposition: A | Discharge status: Home / Self Care | Payer: 59 | Source: Ambulatory Visit | Attending: Obstetrics and Gynecology | Admitting: Obstetrics and Gynecology

## 2021-06-16 ENCOUNTER — Encounter: Payer: Self-pay | Admitting: Obstetrics and Gynecology

## 2021-06-16 VITALS — BP 110/70 | Ht 65.0 in | Wt 112.0 lb

## 2021-06-16 DIAGNOSIS — Z113 Encounter for screening for infections with a predominantly sexual mode of transmission: Secondary | ICD-10-CM | POA: Insufficient documentation

## 2021-06-16 DIAGNOSIS — N3001 Acute cystitis with hematuria: Secondary | ICD-10-CM | POA: Diagnosis not present

## 2021-06-16 DIAGNOSIS — B3731 Acute candidiasis of vulva and vagina: Secondary | ICD-10-CM

## 2021-06-16 LAB — POCT URINALYSIS DIPSTICK
Bilirubin, UA: NEGATIVE
Glucose, UA: NEGATIVE
Ketones, UA: NEGATIVE
Nitrite, UA: NEGATIVE
Protein, UA: NEGATIVE
Spec Grav, UA: 1.015 (ref 1.010–1.025)
pH, UA: 6 (ref 5.0–8.0)

## 2021-06-16 LAB — POCT WET PREP WITH KOH
Clue Cells Wet Prep HPF POC: NEGATIVE
KOH Prep POC: NEGATIVE
Trichomonas, UA: NEGATIVE
Yeast Wet Prep HPF POC: POSITIVE

## 2021-06-16 MED ORDER — FLUCONAZOLE 150 MG PO TABS
150.0000 mg | ORAL_TABLET | Freq: Once | ORAL | 0 refills | Status: AC
  Filled 2021-06-16: qty 2, 2d supply, fill #0

## 2021-06-16 MED ORDER — NITROFURANTOIN MONOHYD MACRO 100 MG PO CAPS
100.0000 mg | ORAL_CAPSULE | Freq: Two times a day (BID) | ORAL | 0 refills | Status: AC
  Filled 2021-06-16: qty 10, 5d supply, fill #0

## 2021-06-16 NOTE — Progress Notes (Signed)
Faren Florence, [name], PA-C   Chief Complaint  Patient presents with   Vaginal Itching    Irritation, abnormal odor, no discharge since yesterday   Urinary Tract Infection    Frequency urinating, no burning since yesterday    HPI:      [name] is a 25 y.o. G0P0000 whose LMP was Patient's last menstrual period was 06/09/2021 (exact date)., presents today for increased vag d/c with itching/irritation and odor since yesterday. No meds to treat. Takes probiotics for frequent yeast vag. Wears thongs daily, uses unscented soap, no dryer sheets. Also with urinary frequency with and without good flow since yesterday and LBP, no dysuria/hematuria, pelvic pain/fevers. No vag bleeding. She is sex active with new partner, not using condoms. On OCPs, doing well.  Last annual 6/22  Past Medical History:  Diagnosis Date   Fibroadenoma of breast, right 05/2017   Human papilloma virus (HPV) type 9 vaccine administered    series completed   Migraines     Past Surgical History:  Procedure Laterality Date   NO PAST SURGERIES      Family History  Problem Relation Age of Onset   Hypertension Father    Migraines Paternal Grandfather    Alzheimer's disease Paternal Grandfather    Hypertension Paternal Grandfather    Breast cancer Other 57   Alzheimer's disease Paternal Grandmother    Hypertension Paternal Grandmother     Social History   Socioeconomic History   Marital status: Single    Spouse name: Not on file   Number of children: Not on file   Years of education: Not on file   Highest education level: Not on file  Occupational History   Not on file  Tobacco Use   Smoking status: Never   Smokeless tobacco: Never  Vaping Use   Vaping Use: Never used  Substance and Sexual Activity   Alcohol use: No   Drug use: No   Sexual activity: Yes    Birth control/protection: Pill  Other Topics Concern   Not on file  Social History Narrative   Not on file   Social Determinants  of Health   Financial Resource Strain: Not on file  Food Insecurity: Not on file  Transportation Needs: Not on file  Physical Activity: Not on file  Stress: Not on file  Social Connections: Not on file  Intimate Partner Violence: Not on file    Outpatient Medications Prior to Visit  Medication Sig Dispense Refill   norgestimate-ethinyl estradiol (ORTHO-CYCLEN) 0.25-35 MG-MCG tablet TAKE 1 TABLET BY MOUTH DAILY. 84 tablet 3   No facility-administered medications prior to visit.      ROS:  Review of Systems  Constitutional:  Negative for fever.  Gastrointestinal:  Negative for blood in stool, constipation, diarrhea, nausea and vomiting.  Genitourinary:  Positive for frequency and vaginal discharge. Negative for dyspareunia, dysuria, flank pain, hematuria, urgency, vaginal bleeding and vaginal pain.  Musculoskeletal:  Positive for back pain.  Skin:  Negative for rash.  BREAST: No symptoms   OBJECTIVE:   Vitals:  BP 110/70   Ht 5\' 5"  (1.651 m)   Wt 112 lb (50.8 kg)   LMP 06/09/2021 (Exact Date)   BMI 18.64 kg/m   Physical Exam Vitals reviewed.  Constitutional:      Appearance: She is well-developed.  Pulmonary:     Effort: Pulmonary effort is normal.  Genitourinary:    General: Normal vulva.     Pubic Area: No rash.  Labia:        Right: No rash, tenderness or lesion.        Left: No rash, tenderness or lesion.      Vagina: Vaginal discharge present. No erythema or tenderness.     Cervix: Normal.     Uterus: Normal. Not enlarged and not tender.      Adnexa: Right adnexa normal and left adnexa normal.       Right: No mass or tenderness.         Left: No mass or tenderness.       Comments: MILD EXT ERYTHEMA AT INTROITUS Musculoskeletal:        General: Normal range of motion.     Cervical back: Normal range of motion.  Skin:    General: Skin is warm and dry.  Neurological:     General: No focal deficit present.     Mental Status: She is alert and  oriented to person, place, and time.  Psychiatric:        Mood and Affect: Mood normal.        Behavior: Behavior normal.        Thought Content: Thought content normal.        Judgment: Judgment normal.    Results: Results for orders placed or performed in visit on 06/16/21 (from the past 24 hour(s))  POCT Wet Prep with KOH     Status: Abnormal   Collection Time: 06/16/21  3:43 PM  Result Value Ref Range   Trichomonas, UA Negative    Clue Cells Wet Prep HPF POC neg    Epithelial Wet Prep HPF POC     Yeast Wet Prep HPF POC pos    Bacteria Wet Prep HPF POC     RBC Wet Prep HPF POC     WBC Wet Prep HPF POC     KOH Prep POC Negative Negative  POCT Urinalysis Dipstick     Status: Abnormal   Collection Time: 06/16/21  3:43 PM  Result Value Ref Range   Color, UA yellow    Clarity, UA clear    Glucose, UA Negative Negative   Bilirubin, UA neg    Ketones, UA neg    Spec Grav, UA 1.015 1.010 - 1.025   Blood, UA trace    pH, UA 6.0 5.0 - 8.0   Protein, UA Negative Negative   Urobilinogen, UA     Nitrite, UA neg    Leukocytes, UA Moderate (2+) (A) Negative   Appearance     Odor       Assessment/Plan: Acute cystitis with hematuria - Plan: nitrofurantoin, macrocrystal-monohydrate, (MACROBID) 100 MG capsule, POCT Urinalysis Dipstick, Urine Culture; pos sx and UA. Rx macrobid, check C&S. F/u prn.   Candidal vaginitis - Plan: POCT Wet Prep with KOH, fluconazole (DIFLUCAN) 150 MG tablet; pos sx and wet prep. Rx diflucan. D/C THONGS!!! Cont probiotics. F/u prn.   Screening for STD (sexually transmitted disease) - Plan: Cervicovaginal ancillary only   Meds ordered this encounter  Medications   nitrofurantoin, macrocrystal-monohydrate, (MACROBID) 100 MG capsule    Sig: Take 1 capsule (100 mg total) by mouth 2 (two) times daily for 5 days.    Dispense:  10 capsule    Refill:  0    Order Specific Question:   Supervising Provider    Answer:   Gae Dry [[id number]]   fluconazole  (DIFLUCAN) 150 MG tablet    Sig: Take 1 tablet (150 mg total) by mouth  once for 1 dose. May repeat in 3 days if still having symptoms    Dispense:  2 tablet    Refill:  0    Order Specific Question:   Supervising Provider    Answer:   Gae Dry [[id number]]       Return if symptoms worsen or fail to improve.  Antoine Fiallos B. Carlina Derks, PA-C 06/16/2021 3:45 PM

## 2021-06-18 LAB — CERVICOVAGINAL ANCILLARY ONLY
Chlamydia: NEGATIVE
Comment: NEGATIVE
Comment: NORMAL
Neisseria Gonorrhea: NEGATIVE

## 2021-06-19 ENCOUNTER — Encounter: Payer: Self-pay | Admitting: Obstetrics and Gynecology

## 2021-06-21 ENCOUNTER — Other Ambulatory Visit: Payer: Self-pay

## 2021-06-21 MED ORDER — TERCONAZOLE 0.8 % VA CREA
1.0000 | TOPICAL_CREAM | Freq: Every day | VAGINAL | 0 refills | Status: AC
  Filled 2021-06-21: qty 20, 3d supply, fill #0

## 2021-06-22 LAB — URINE CULTURE: Organism ID, Bacteria: NO GROWTH

## 2021-07-18 IMAGING — US US BREAST*R* LIMITED INC AXILLA
1 series · 5 of 5 positions shown · non-contrast
Comparison: Previous exam(s).

CLINICAL DATA: Follow-up probably benign palpable mass in the 3:30
o'clock position of the right breast. The patient reports no
noticeable change in the mass to her palpation.

EXAM:
ULTRASOUND OF THE RIGHT BREAST

[Series 1: us breast*right* limited inc axilla · 0.05mm/px · 5 of 5 slices shown]
[im 1/5]
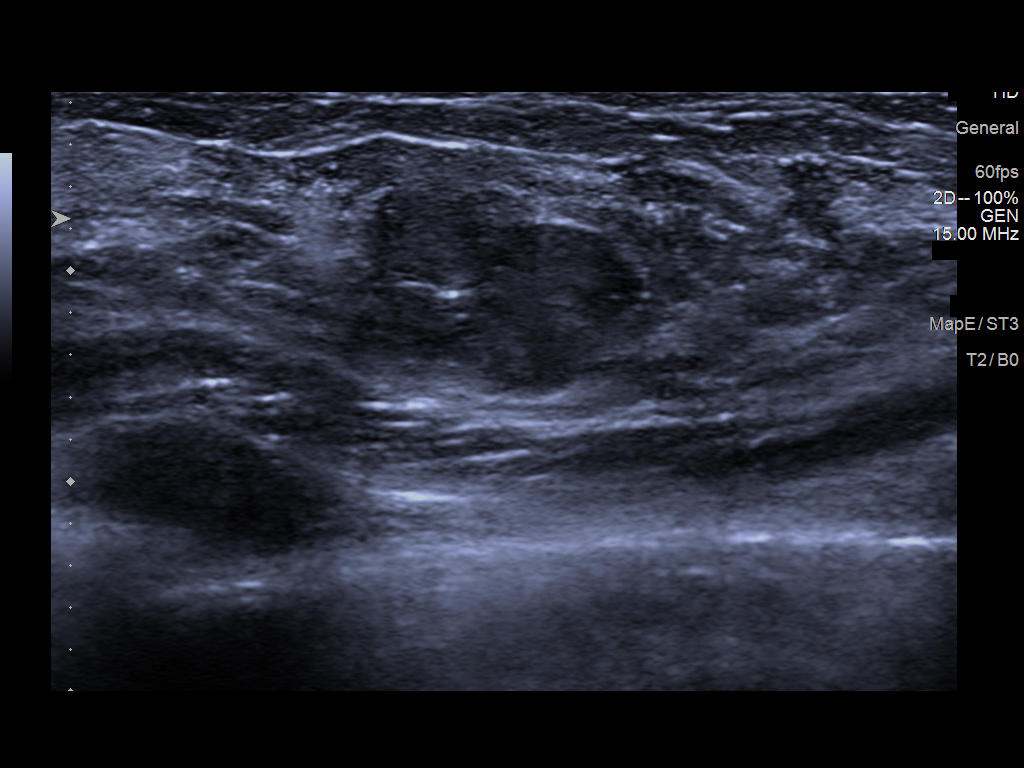
[im 2/5]
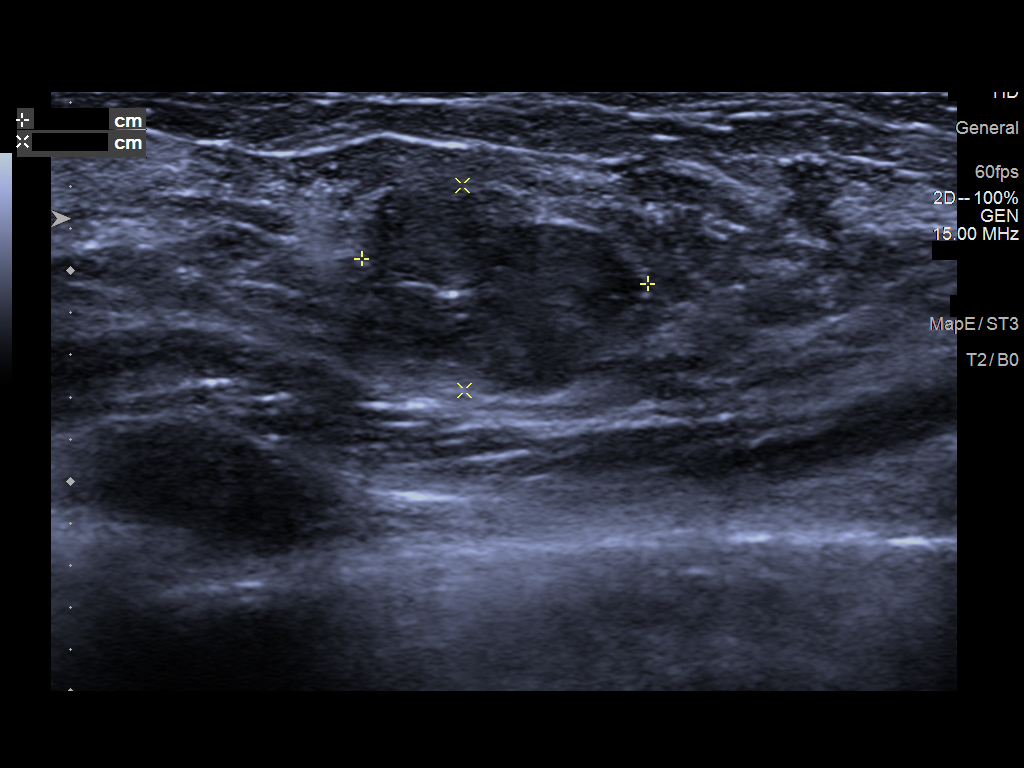
[im 3/5]
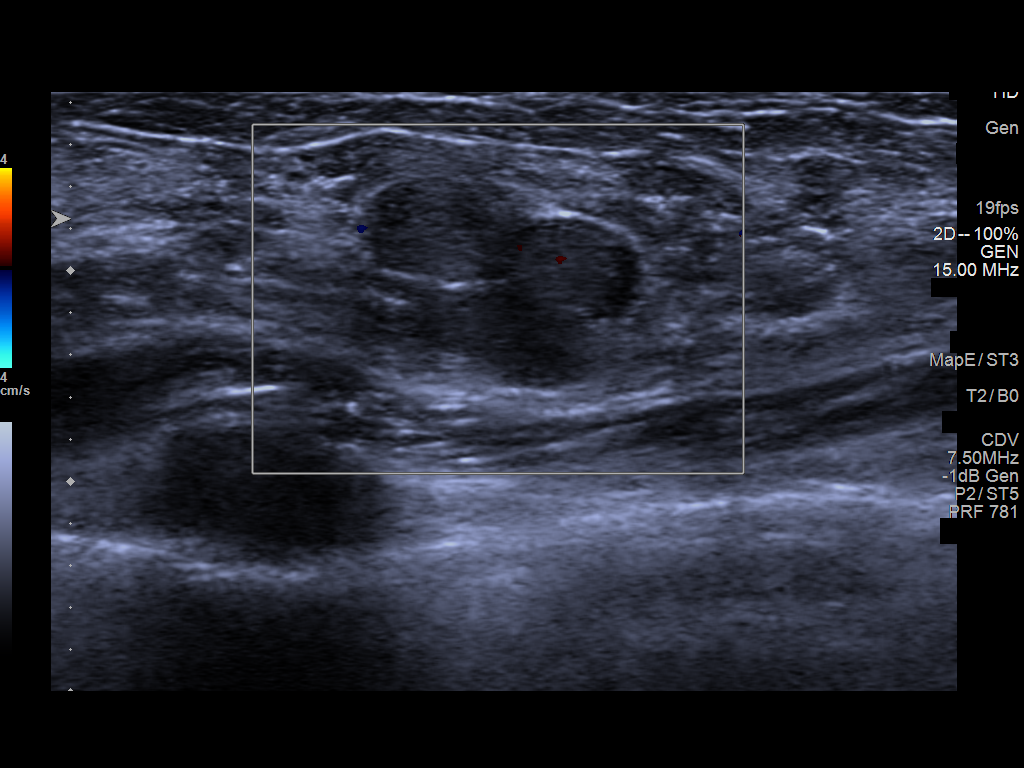
[im 4/5]
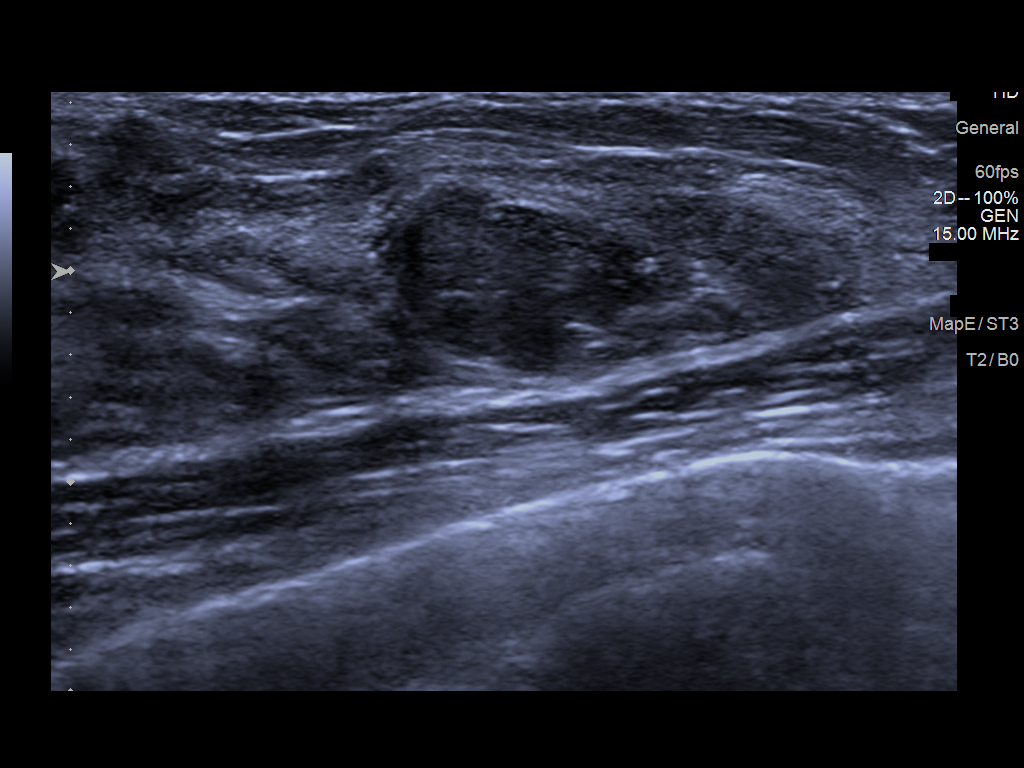
[im 5/5]
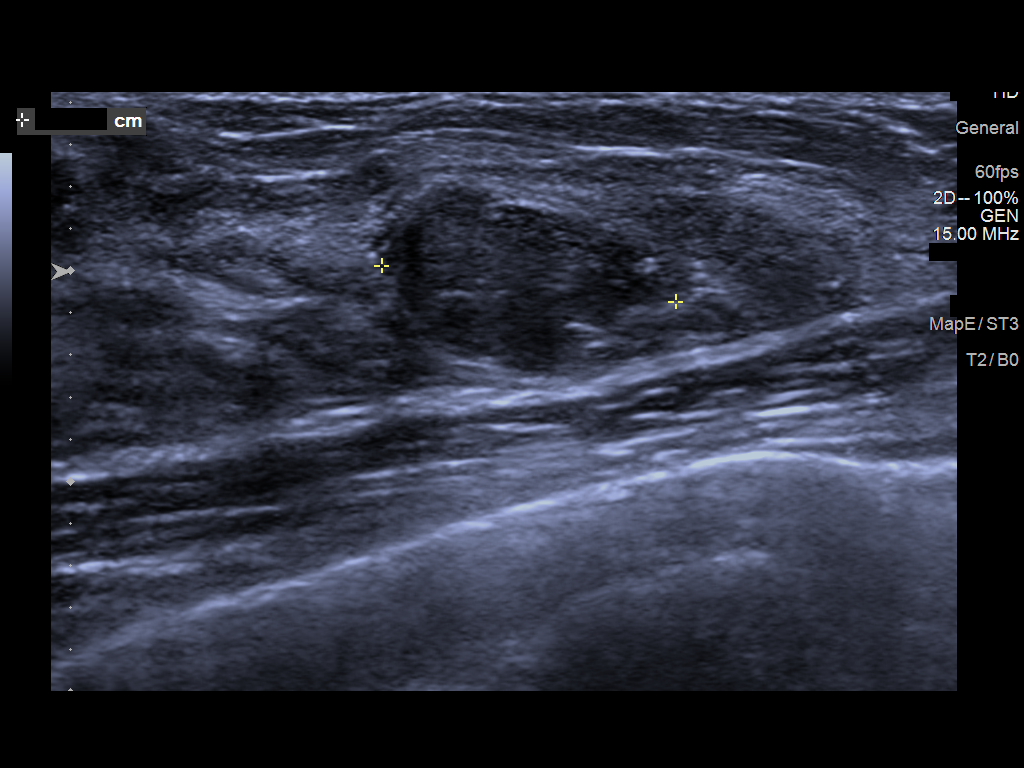

[5 of 5 positions shown; findings below may reference images not displayed]

FINDINGS: Targeted ultrasound is performed, showing a stable circumscribed,
gently lobulated mass in the 3:30 o'clock position of the right
breast, 3 cm from the nipple. This currently measures 1.4 x 1.4 x
1.0 cm. This measured 1.5 x 1.3 x 0.8 cm on 07/16/2019 and 1.6 x
x 0.8 cm on 01/24/2019.
IMPRESSION: No significant change in a probable benign fibroadenoma in the 3:30
o'clock position of the right breast.

RECOMMENDATION:
Right breast ultrasound in 1 year to complete greater than 2 years
of follow-up of the probable benign fibroadenoma in the 3:30 o'clock
position right breast.

I have discussed the findings and recommendations with the patient.
If applicable, a reminder letter will be sent to the patient
regarding the next appointment.

BI-RADS CATEGORY  3: Probably benign.

## 2021-07-29 ENCOUNTER — Ambulatory Visit
Admission: RE | Admit: 2021-07-29 | Discharge: 2021-07-29 | Disposition: A | Discharge status: Home / Self Care | Payer: 59 | Source: Ambulatory Visit | Attending: Obstetrics and Gynecology | Admitting: Obstetrics and Gynecology

## 2021-07-29 ENCOUNTER — Other Ambulatory Visit: Payer: Self-pay

## 2021-07-29 DIAGNOSIS — D241 Benign neoplasm of right breast: Secondary | ICD-10-CM | POA: Diagnosis not present

## 2021-07-29 DIAGNOSIS — N6314 Unspecified lump in the right breast, lower inner quadrant: Secondary | ICD-10-CM | POA: Diagnosis not present

## 2021-07-30 ENCOUNTER — Encounter: Payer: Self-pay | Admitting: Obstetrics and Gynecology

## 2021-08-05 ENCOUNTER — Other Ambulatory Visit: Payer: Self-pay

## 2021-09-15 ENCOUNTER — Telehealth: Payer: Self-pay

## 2021-09-15 NOTE — Telephone Encounter (Signed)
Patient called in at 3:11 PM stating she has been spotting since Monday and wanted to know if that is normal?  Pt is having brownish/pink discharge. Pt hasn't been sexually active since 3 weeks.  Advised her to take a pregnancy test tomorrow morning (2/9) and let us know the results and how her symptoms are.

## 2021-09-16 ENCOUNTER — Encounter: Payer: Self-pay | Admitting: Obstetrics and Gynecology

## 2021-09-20 DIAGNOSIS — L723 Sebaceous cyst: Secondary | ICD-10-CM | POA: Diagnosis not present

## 2021-09-21 ENCOUNTER — Other Ambulatory Visit: Payer: Self-pay

## 2021-09-21 MED ORDER — CEPHALEXIN 500 MG PO CAPS
ORAL_CAPSULE | ORAL | 0 refills | Status: DC
  Filled 2021-09-21: qty 14, 7d supply, fill #0

## 2021-09-28 ENCOUNTER — Telehealth: Payer: Self-pay

## 2021-09-28 NOTE — Telephone Encounter (Signed)
Pt calling; was seen in [facility] last week for cyst/bump/lump under her arm; was rx'd keflex to take for 7d; has finished rx and cyst/lump/bump is still there; should she go back to [place] or see [facility]?  [phone]

## 2021-09-29 ENCOUNTER — Other Ambulatory Visit: Payer: Self-pay | Admitting: General Surgery

## 2021-09-29 DIAGNOSIS — L728 Other follicular cysts of the skin and subcutaneous tissue: Secondary | ICD-10-CM | POA: Diagnosis not present

## 2021-09-29 NOTE — Telephone Encounter (Signed)
Called pt to schedule breast exam with [facility].  Had to leave message for pt to call the office.

## 2021-09-30 ENCOUNTER — Encounter: Payer: Self-pay | Admitting: Obstetrics and Gynecology

## 2021-09-30 LAB — SURGICAL PATHOLOGY

## 2021-10-01 NOTE — Telephone Encounter (Signed)
Patient states she was already seen by [facility] clinic. No longer needs appointment

## 2021-10-01 NOTE — Telephone Encounter (Signed)
Called and left voicemail for patient to call back to be scheduled. 

## 2021-10-04 ENCOUNTER — Encounter: Payer: Self-pay | Admitting: Obstetrics and Gynecology

## 2021-10-05 ENCOUNTER — Other Ambulatory Visit: Payer: Self-pay

## 2021-10-05 ENCOUNTER — Ambulatory Visit (INDEPENDENT_AMBULATORY_CARE_PROVIDER_SITE_OTHER): Payer: BC Managed Care – PPO | Admitting: Obstetrics and Gynecology

## 2021-10-05 ENCOUNTER — Encounter: Payer: Self-pay | Admitting: Obstetrics and Gynecology

## 2021-10-05 VITALS — BP 90/60 | Ht 65.0 in | Wt 107.0 lb

## 2021-10-05 DIAGNOSIS — N3001 Acute cystitis with hematuria: Secondary | ICD-10-CM | POA: Diagnosis not present

## 2021-10-05 LAB — POCT URINALYSIS DIPSTICK
Bilirubin, UA: NEGATIVE
Glucose, UA: NEGATIVE
Ketones, UA: NEGATIVE
Nitrite, UA: POSITIVE
Protein, UA: NEGATIVE
Spec Grav, UA: 1.02 (ref 1.010–1.025)
pH, UA: 6 (ref 5.0–8.0)

## 2021-10-05 MED ORDER — NITROFURANTOIN MONOHYD MACRO 100 MG PO CAPS
100.0000 mg | ORAL_CAPSULE | Freq: Two times a day (BID) | ORAL | 0 refills | Status: AC
  Filled 2021-10-05: qty 10, 5d supply, fill #0

## 2021-10-05 NOTE — Progress Notes (Signed)
Tina Peterson, Tina Evener, PA-C   Chief Complaint  Patient presents with   Urinary Tract Infection    Urinary frequency and burning, blood in urine, lower back pain x 3 days    HPI:      Tina Peterson is a 26 y.o. G0P0000 whose LMP was Patient's last menstrual period was 09/28/2021 (exact date)., presents today for urinary frequency and urgency for 3 days, with decreased flow. Going to bathroom every 7-10 min last night. With dysuria and chunks of tissue/blood in urine. No LBP, pelvic pain, fevers. No vag sx. Stopped menses a few days ago. Has had similar sx (without hematuria/urine "debris") in past, usually after menses for a few days, then resolves. Last 2 C&S were neg 11/22 and 9/21 when sx were post menses. Pt drinking lots of water, taking probiotics, limited caffeine to once wkly now, voids before and after sex.  Pt had axillary cyst excised a few wks ago, was on keflex BID for 7 days, stopped about a wk ago. No vag sx  Patient Active Problem List   Diagnosis Date Noted   Fibroadenoma of breast, right 06/05/2017   Mass of upper outer quadrant of right breast 05/24/2017    Past Surgical History:  Procedure Laterality Date   NO PAST SURGERIES      Family History  Problem Relation Age of Onset   Hypertension Father    Migraines Paternal Grandfather    Alzheimer's disease Paternal Grandfather    Hypertension Paternal Grandfather    Breast cancer Other 76   Alzheimer's disease Paternal Grandmother    Hypertension Paternal Grandmother     Social History   Socioeconomic History   Marital status: Single    Spouse name: Not on file   Number of children: Not on file   Years of education: Not on file   Highest education level: Not on file  Occupational History   Not on file  Tobacco Use   Smoking status: Never   Smokeless tobacco: Never  Vaping Use   Vaping Use: Never used  Substance and Sexual Activity   Alcohol use: No   Drug use: No   Sexual activity: Yes    Birth  control/protection: Pill  Other Topics Concern   Not on file  Social History Narrative   Not on file   Social Determinants of Health   Financial Resource Strain: Not on file  Food Insecurity: Not on file  Transportation Needs: Not on file  Physical Activity: Not on file  Stress: Not on file  Social Connections: Not on file  Intimate Partner Violence: Not on file    Outpatient Medications Prior to Visit  Medication Sig Dispense Refill   norgestimate-ethinyl estradiol (ORTHO-CYCLEN) 0.25-35 MG-MCG tablet TAKE 1 TABLET BY MOUTH DAILY. 84 tablet 3   cephALEXin (KEFLEX) 500 MG capsule One BID x 7 days 14 capsule 0   No facility-administered medications prior to visit.      ROS:  Review of Systems  Constitutional:  Negative for fever.  Gastrointestinal:  Negative for blood in stool, constipation, diarrhea, nausea and vomiting.  Genitourinary:  Positive for dysuria, frequency, hematuria and urgency. Negative for dyspareunia, flank pain, vaginal bleeding, vaginal discharge and vaginal pain.  Musculoskeletal:  Negative for back pain.  Skin:  Negative for rash.  BREAST: No symptoms   OBJECTIVE:   Vitals:  BP 90/60    Ht 5\' 5"  (1.651 m)    Wt 107 lb (48.5 kg)    LMP  09/28/2021 (Exact Date)    BMI 17.81 kg/m   Physical Exam Vitals reviewed.  Constitutional:      Appearance: She is well-developed.  Pulmonary:     Effort: Pulmonary effort is normal.  Musculoskeletal:        General: Normal range of motion.     Cervical back: Normal range of motion.  Skin:    General: Skin is warm and dry.  Neurological:     General: No focal deficit present.     Mental Status: She is alert and oriented to person, place, and time.     Cranial Nerves: No cranial nerve deficit.  Psychiatric:        Mood and Affect: Mood normal.        Behavior: Behavior normal.        Thought Content: Thought content normal.        Judgment: Judgment normal.    Results: Results for orders placed or  performed in visit on 10/05/21 (from the past 24 hour(s))  POCT Urinalysis Dipstick     Status: Abnormal   Collection Time: 10/05/21  4:27 PM  Result Value Ref Range   Color, UA yellow    Clarity, UA cloudy    Glucose, UA Negative Negative   Bilirubin, UA neg    Ketones, UA neg    Spec Grav, UA 1.020 1.010 - 1.025   Blood, UA large    pH, UA 6.0 5.0 - 8.0   Protein, UA Negative Negative   Urobilinogen, UA     Nitrite, UA pos    Leukocytes, UA Small (1+) (A) Negative   Appearance tissue in urine    Odor       Assessment/Plan: Acute cystitis with hematuria - Plan: POCT Urinalysis Dipstick, Urine Culture, nitrofurantoin, macrocrystal-monohydrate, (MACROBID) 100 MG capsule; pos sx and UA. Rx macrobid, check C&S. F/u prn. Cont probiotics, add cranberry.   Bladder spasms--think these are sx just after menses that resolve on their own (without hematuria). Can do AZO/increase water and then f/u if sx persist. Could also be urethritis after tampon use.     Meds ordered this encounter  Medications   nitrofurantoin, macrocrystal-monohydrate, (MACROBID) 100 MG capsule    Sig: Take 1 capsule (100 mg total) by mouth 2 (two) times daily for 5 days.    Dispense:  10 capsule    Refill:  0    Order Specific Question:   Supervising Provider    Answer:   Gae Dry [[id number]]      Return if symptoms worsen or fail to improve.  Aanyah Loa B. Liliani Bobo, PA-C 10/05/2021 4:30 PM

## 2021-10-05 NOTE — Patient Instructions (Signed)
I value your feedback and you entrusting us with your care. If you get a [facility] patient survey, I would appreciate you taking the time to let us know about your experience today. Thank you! ? ? ?

## 2021-10-08 LAB — URINE CULTURE

## 2021-10-29 ENCOUNTER — Other Ambulatory Visit: Payer: Self-pay

## 2022-01-16 ENCOUNTER — Other Ambulatory Visit: Payer: Self-pay | Admitting: Obstetrics and Gynecology

## 2022-01-16 DIAGNOSIS — Z3041 Encounter for surveillance of contraceptive pills: Secondary | ICD-10-CM

## 2022-01-17 ENCOUNTER — Other Ambulatory Visit: Payer: Self-pay

## 2022-01-18 ENCOUNTER — Other Ambulatory Visit: Payer: Self-pay

## 2022-01-19 ENCOUNTER — Encounter: Payer: Self-pay | Admitting: Obstetrics and Gynecology

## 2022-01-19 DIAGNOSIS — Z3041 Encounter for surveillance of contraceptive pills: Secondary | ICD-10-CM

## 2022-01-20 ENCOUNTER — Other Ambulatory Visit: Payer: Self-pay

## 2022-01-20 MED ORDER — NORGESTIMATE-ETH ESTRADIOL 0.25-35 MG-MCG PO TABS
1.0000 | ORAL_TABLET | Freq: Every day | ORAL | 0 refills | Status: DC
  Filled 2022-01-20: qty 84, 84d supply, fill #0

## 2022-01-20 NOTE — Telephone Encounter (Signed)
Patient is scheduled for annual on 03/15/22 with [facility]. Patient is requesting refill. Please advise

## 2022-03-14 NOTE — Progress Notes (Unsigned)
No chief complaint on file.    HPI:      [name] is a 26 y.o. G0P0000 who LMP was No LMP recorded., presents today for her annual examination. Her menses are monthly, lasting 6-8 days, light to mod flow. No dysmen, BTB resolved with OCP change. Pt ok with duration of flow.    Sex activity: not currently (not since last yr); on OCPs. Last pap: 01/15/19  Results: normal Hx of STDs: none; testing not indicated this yr since not active since last neg test   There is a FH of breast cancer in her mat grt aunt, genetic testing not indicated. There is no FH of ovarian cancer. The patient does do self-breast exams. Had RT breast mass 10/18 and u/s showed RT breast fibroadenoma, several stable repeat u/s since; last one 12/21. Area hasn't changed in size. Next u/s due 12/22. Noticed a new RT breast mass last yr. Had cat 3 breast u/s 6/20 and 12/20 and repeat due again 6/21. No change in size.   Tobacco use: The patient denies current or previous tobacco use. Alcohol use: social No drug use Exercise: very active   She does get adequate calcium and Vitamin D in her diet. Gardasil completed.    Past Medical History:  Diagnosis Date   Fibroadenoma of breast, right 05/2017   Human papilloma virus (HPV) type 9 vaccine administered    series completed   Migraines     Past Surgical History:  Procedure Laterality Date   NO PAST SURGERIES      Family History  Problem Relation Age of Onset   Hypertension Father    Migraines Paternal Grandfather    Alzheimer's disease Paternal Grandfather    Hypertension Paternal Grandfather    Breast cancer Other 43   Alzheimer's disease Paternal Grandmother    Hypertension Paternal Grandmother     Social History   Socioeconomic History   Marital status: Single    Spouse name: Not on file   Number of children: Not on file   Years of education: Not on file   Highest education level: Not on file  Occupational History   Not on file   Tobacco Use   Smoking status: Never   Smokeless tobacco: Never  Vaping Use   Vaping Use: Never used  Substance and Sexual Activity   Alcohol use: No   Drug use: No   Sexual activity: Yes    Birth control/protection: Pill  Other Topics Concern   Not on file  Social History Narrative   Not on file   Social Determinants of Health   Financial Resource Strain: Not on file  Food Insecurity: Not on file  Transportation Needs: Not on file  Physical Activity: Not on file  Stress: Not on file  Social Connections: Not on file  Intimate Partner Violence: Not on file    Current Outpatient Medications on File Prior to Visit  Medication Sig Dispense Refill   norgestimate-ethinyl estradiol (ORTHO-CYCLEN) 0.25-35 MG-MCG tablet TAKE 1 TABLET BY MOUTH DAILY. 84 tablet 0   No current facility-administered medications on file prior to visit.    ROS:  Review of Systems  Constitutional:  Negative for fatigue, fever and unexpected weight change.  Respiratory:  Negative for cough, shortness of breath and wheezing.   Cardiovascular:  Negative for chest pain, palpitations and leg swelling.  Gastrointestinal:  Negative for blood in stool, constipation, diarrhea, nausea and vomiting.  Endocrine: Negative for cold intolerance, heat intolerance and  polyuria.  Genitourinary:  Negative for dyspareunia, dysuria, flank pain, frequency, genital sores, hematuria, menstrual problem, pelvic pain, urgency, vaginal bleeding, vaginal discharge and vaginal pain.  Musculoskeletal:  Negative for back pain, joint swelling and myalgias.  Skin:  Negative for rash.  Neurological:  Negative for dizziness, syncope, light-headedness, numbness and headaches.  Hematological:  Negative for adenopathy.  Psychiatric/Behavioral:  Negative for agitation, confusion, sleep disturbance and suicidal ideas. The patient is not nervous/anxious.   BREAST: breast tenderness and mass   Objective: There were no vitals taken for this  visit.   Physical Exam Constitutional:      Appearance: She is well-developed.  Genitourinary:     Vulva normal.     No vaginal discharge, erythema or tenderness.      Right Adnexa: not tender and no mass present.    Left Adnexa: not tender and no mass present.    No cervical motion tenderness or polyp.     Uterus is not enlarged or tender.  Breasts:    Right: Mass present. No nipple discharge, skin change or tenderness.     Left: No mass, nipple discharge, skin change or tenderness.  Neck:     Thyroid: No thyromegaly.  Cardiovascular:     Rate and Rhythm: Normal rate and regular rhythm.     Heart sounds: Normal heart sounds. No murmur heard. Pulmonary:     Effort: Pulmonary effort is normal.     Breath sounds: Normal breath sounds.  Chest:    Abdominal:     Palpations: Abdomen is soft.     Tenderness: There is no abdominal tenderness. There is no guarding.  Musculoskeletal:        General: Normal range of motion.     Cervical back: Normal range of motion.  Neurological:     General: No focal deficit present.     Mental Status: She is alert and oriented to person, place, and time.     Cranial Nerves: No cranial nerve deficit.  Skin:    General: Skin is warm and dry.  Psychiatric:        Mood and Affect: Mood normal.        Behavior: Behavior normal.        Thought Content: Thought content normal.        Judgment: Judgment normal.  Vitals reviewed.     Assessment/Plan: Encounter for annual routine gynecological examination  Encounter for surveillance of contraceptive pills - Plan: norgestimate-ethinyl estradiol (ORTHO-CYCLEN) 0.25-35 MG-MCG tablet; OCP RF  Fibroadenoma of breast, right - Plan: US BREAST LTD UNI RIGHT INC AXILLA; pt to sched RT breast f/u u/s 12/22.  No orders of the defined types were placed in this encounter.             GYN counsel breast self exam, adequate intake of calcium and vitamin D, diet and exercise     F/U  No follow-ups on  file.  Novie Maggio B. Trinton Prewitt, PA-C 03/14/2022 1:28 PM

## 2022-03-15 ENCOUNTER — Other Ambulatory Visit (HOSPITAL_COMMUNITY)
Admission: RE | Admit: 2022-03-15 | Discharge: 2022-03-15 | Disposition: A | Discharge status: Home / Self Care | Payer: 59 | Source: Ambulatory Visit | Attending: Obstetrics and Gynecology | Admitting: Obstetrics and Gynecology

## 2022-03-15 ENCOUNTER — Other Ambulatory Visit: Payer: Self-pay

## 2022-03-15 ENCOUNTER — Encounter: Payer: Self-pay | Admitting: Obstetrics and Gynecology

## 2022-03-15 ENCOUNTER — Ambulatory Visit (INDEPENDENT_AMBULATORY_CARE_PROVIDER_SITE_OTHER): Payer: BC Managed Care – PPO | Admitting: Obstetrics and Gynecology

## 2022-03-15 VITALS — BP 108/70 | Ht 65.0 in | Wt 105.0 lb

## 2022-03-15 DIAGNOSIS — Z01419 Encounter for gynecological examination (general) (routine) without abnormal findings: Secondary | ICD-10-CM | POA: Diagnosis not present

## 2022-03-15 DIAGNOSIS — Z113 Encounter for screening for infections with a predominantly sexual mode of transmission: Secondary | ICD-10-CM

## 2022-03-15 DIAGNOSIS — Z124 Encounter for screening for malignant neoplasm of cervix: Secondary | ICD-10-CM

## 2022-03-15 DIAGNOSIS — Z3041 Encounter for surveillance of contraceptive pills: Secondary | ICD-10-CM

## 2022-03-15 MED ORDER — NORGESTIMATE-ETH ESTRADIOL 0.25-35 MG-MCG PO TABS
1.0000 | ORAL_TABLET | Freq: Every day | ORAL | 3 refills | Status: DC
  Filled 2022-03-15 – 2022-04-13 (×4): qty 84, 84d supply, fill #0
  Filled 2022-07-07: qty 84, 84d supply, fill #1
  Filled 2022-09-29: qty 84, 84d supply, fill #2
  Filled 2022-12-19: qty 84, 84d supply, fill #3

## 2022-03-15 NOTE — Patient Instructions (Signed)
I value your feedback and you entrusting us with your care. If you get a [facility] patient survey, I would appreciate you taking the time to let us know about your experience today. Thank you! ? ? ?

## 2022-03-17 LAB — CYTOLOGY - PAP
Adequacy: ABSENT
Chlamydia: NEGATIVE
Comment: NEGATIVE
Comment: NORMAL
Diagnosis: NEGATIVE
Neisseria Gonorrhea: NEGATIVE

## 2022-03-18 ENCOUNTER — Other Ambulatory Visit: Payer: Self-pay

## 2022-04-13 ENCOUNTER — Other Ambulatory Visit: Payer: Self-pay

## 2022-04-14 ENCOUNTER — Other Ambulatory Visit: Payer: Self-pay

## 2022-07-07 ENCOUNTER — Other Ambulatory Visit: Payer: Self-pay

## 2022-07-15 ENCOUNTER — Other Ambulatory Visit: Payer: Self-pay

## 2022-07-15 MED ORDER — AMOXICILLIN 500 MG PO CAPS
2000.0000 mg | ORAL_CAPSULE | Freq: Once | ORAL | 0 refills | Status: AC
  Filled 2022-07-15: qty 4, 1d supply, fill #0

## 2022-07-19 IMAGING — US US BREAST*R* LIMITED INC AXILLA
1 series · 6 of 6 positions shown · non-contrast
Comparison: Previous exam(s).

CLINICAL DATA: BI-RADS 3 follow-up of a RIGHT breast mass. This is
initiated and in January 2019. Patient has a history of an additional
RIGHT breast mass which has completed BI-RADS 3 follow-up.

EXAM:
ULTRASOUND OF THE RIGHT BREAST

[Series 1: us breast*right* limited inc axilla · 0.04mm/px · 6 of 6 slices shown]
[im 1/6]
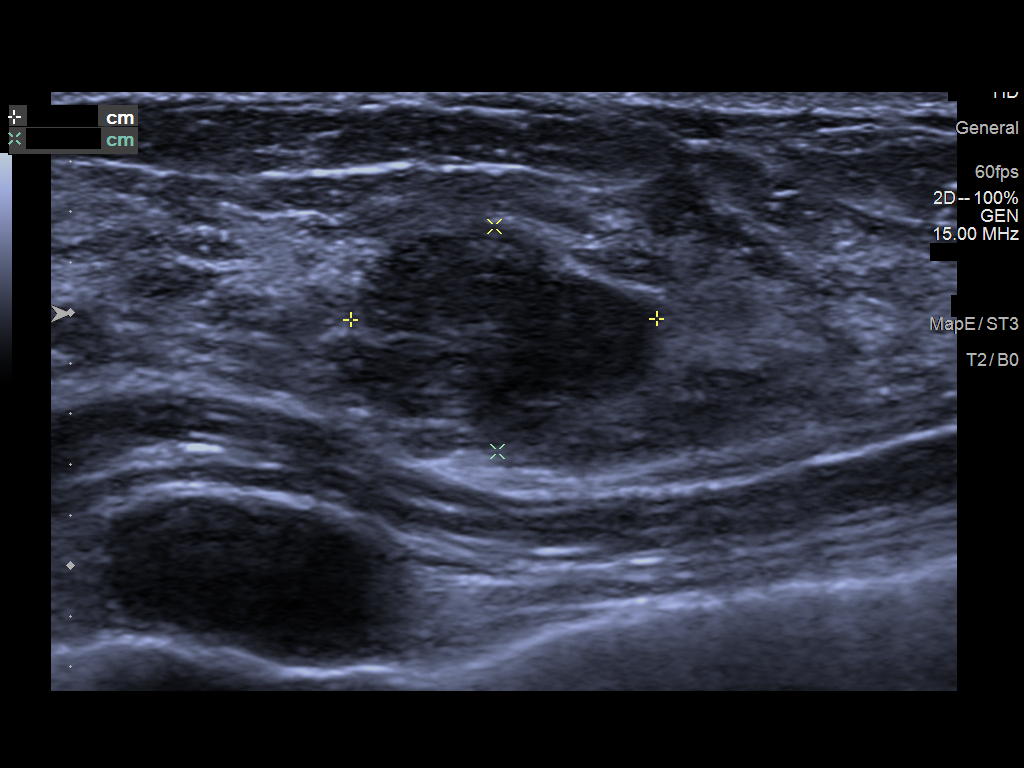
[im 2/6]
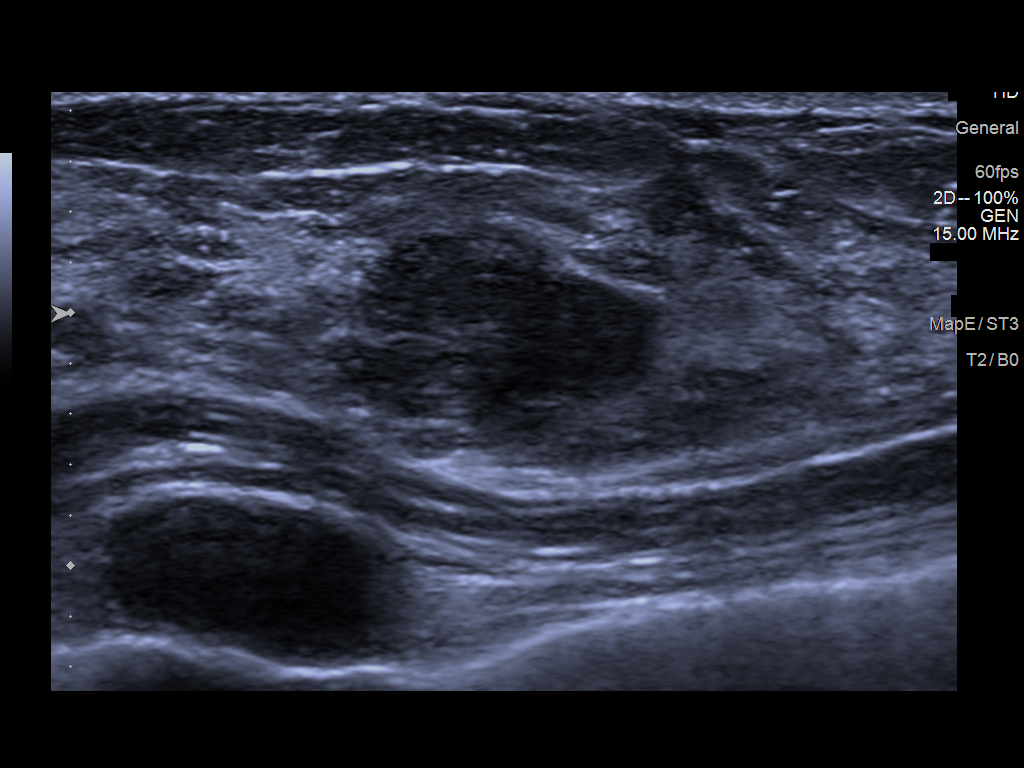
[im 3/6]
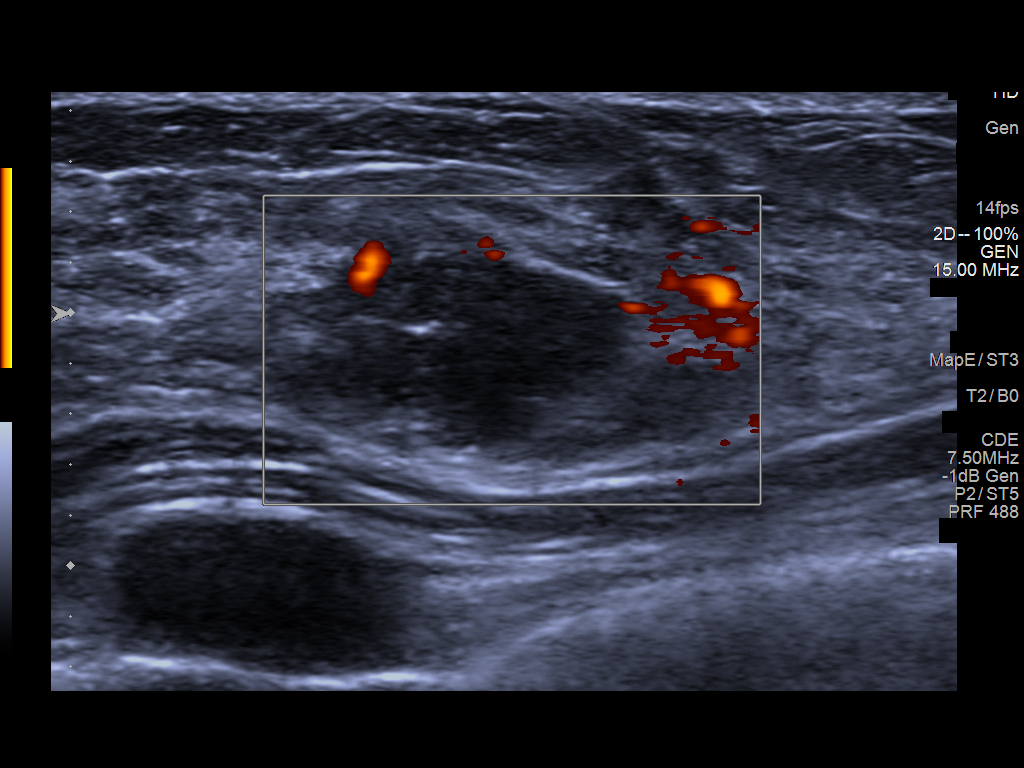
[im 4/6]
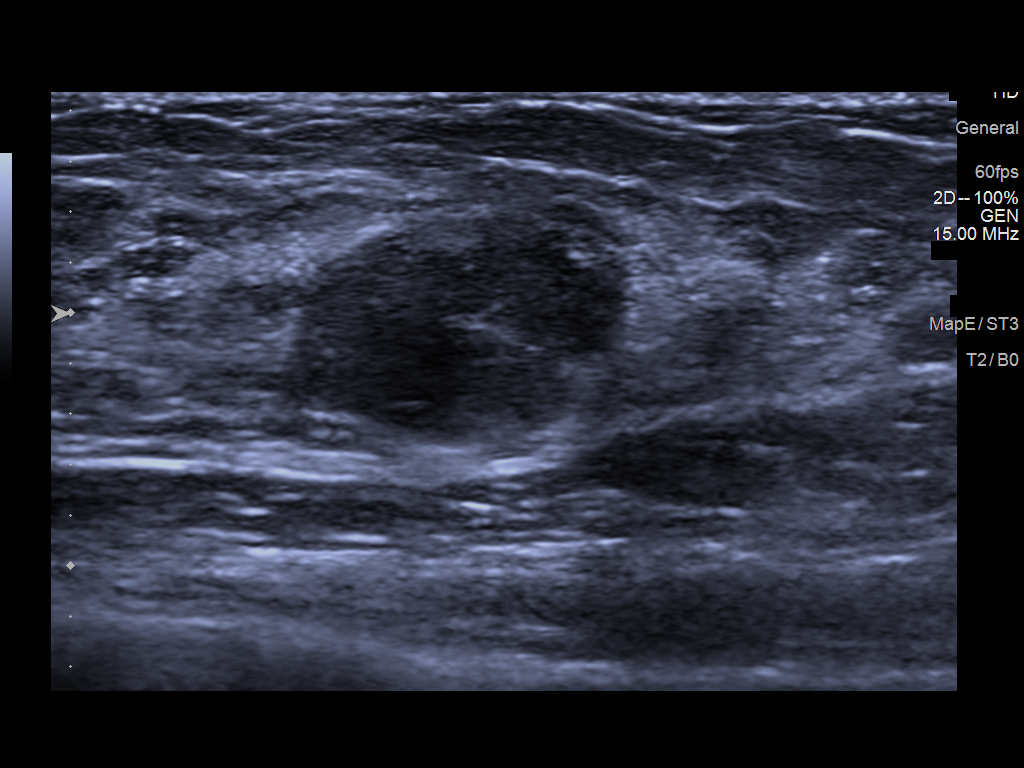
[im 5/6]
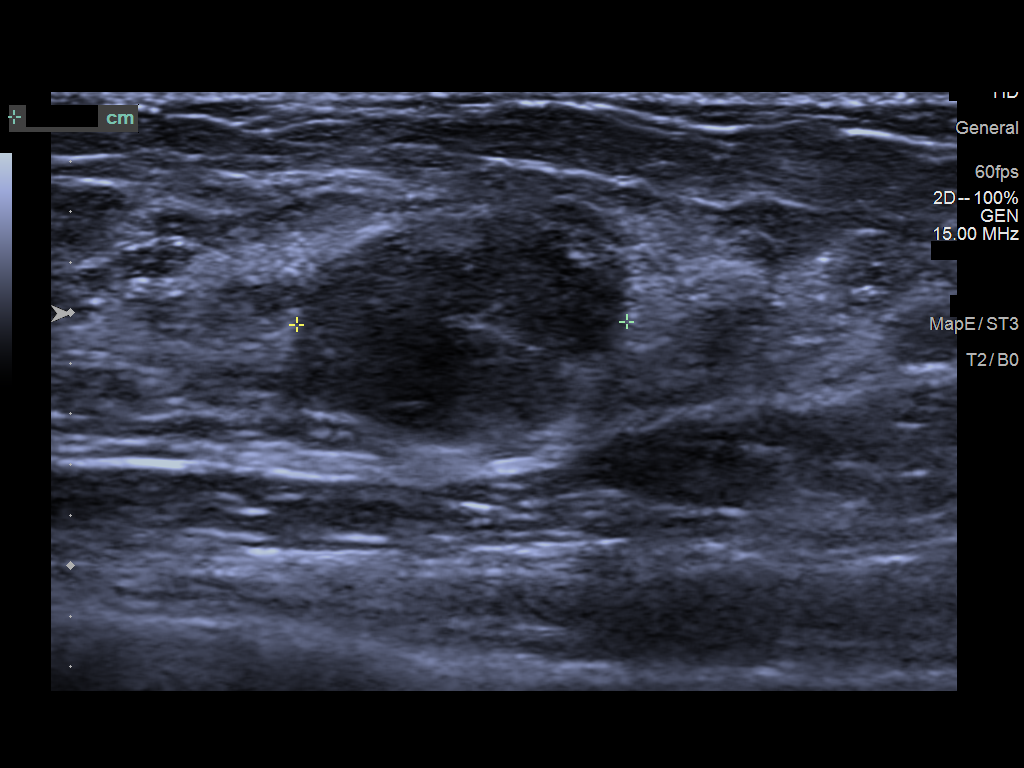
[im 6/6]
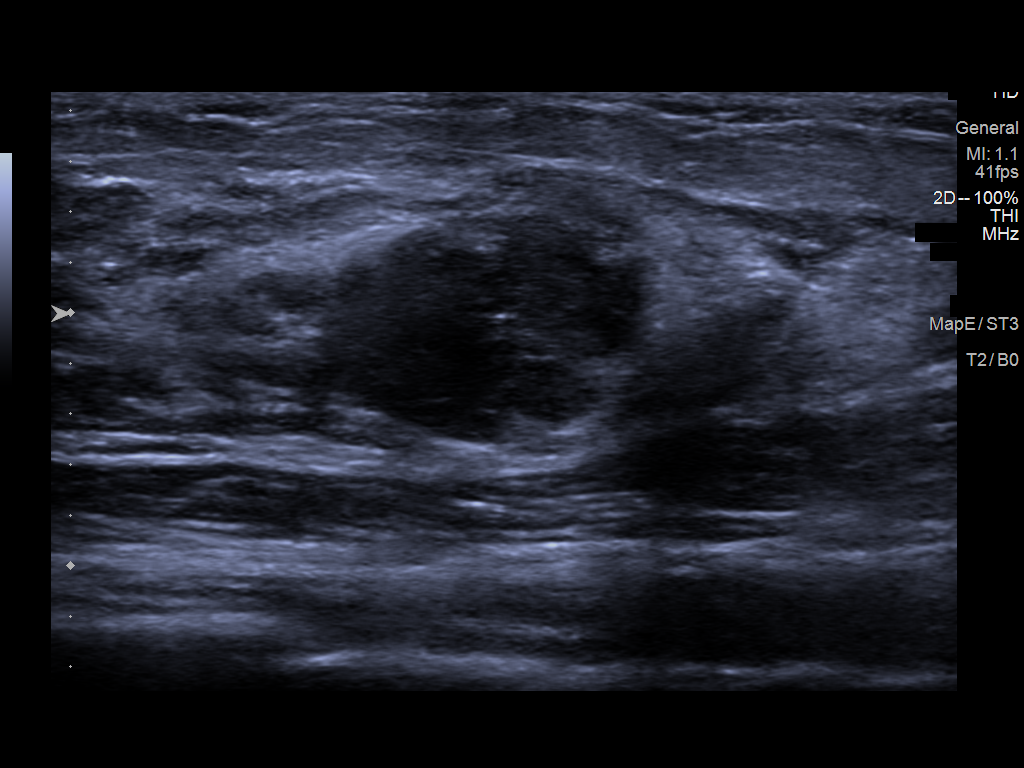

[6 of 6 positions shown; findings below may reference images not displayed]

FINDINGS: Targeted ultrasound was performed of the RIGHT breast. At [DATE] 3 cm
from the nipple, there is revisualization of an oval circumscribed
hypoechoic mass. It measures 13 x 12 x 9 mm, previously 14 x 10 x 14
mm. Differences in measurement are likely due to differences in scan
plane technique.
IMPRESSION: Stable RIGHT breast mass at [DATE] 3 cm from the nipple for greater
than 2 years. This is consistent with a benign etiology such as a
fibroadenoma. No additional dedicated follow-up is indicated for
this benign mass.

RECOMMENDATION:
Screening mammogram at age 41 unless there are persistent or
intervening clinical concerns. (Code:F5-1-WH7)

I have discussed the findings and recommendations with the patient.
If applicable, a reminder letter will be sent to the patient
regarding the next appointment.

BI-RADS CATEGORY  2: Benign.

## 2022-07-28 ENCOUNTER — Other Ambulatory Visit: Payer: Self-pay

## 2022-07-28 MED ORDER — PROMETHAZINE HCL 25 MG PO TABS
25.0000 mg | ORAL_TABLET | Freq: Four times a day (QID) | ORAL | 0 refills | Status: DC | PRN
  Filled 2022-07-28: qty 10, 3d supply, fill #0

## 2022-07-28 MED ORDER — HYDROCODONE-ACETAMINOPHEN 5-325 MG PO TABS
1.0000 | ORAL_TABLET | ORAL | 0 refills | Status: DC
  Filled 2022-07-28: qty 16, 5d supply, fill #0

## 2022-08-04 ENCOUNTER — Other Ambulatory Visit: Payer: Self-pay

## 2022-09-29 ENCOUNTER — Other Ambulatory Visit: Payer: Self-pay

## 2022-12-20 ENCOUNTER — Other Ambulatory Visit: Payer: Self-pay

## 2023-01-26 ENCOUNTER — Other Ambulatory Visit: Payer: Self-pay

## 2023-01-26 MED ORDER — CEPHALEXIN 500 MG PO CAPS
500.0000 mg | ORAL_CAPSULE | Freq: Two times a day (BID) | ORAL | 0 refills | Status: DC
  Filled 2023-01-26: qty 21, 11d supply, fill #0

## 2023-02-23 ENCOUNTER — Other Ambulatory Visit: Payer: Self-pay

## 2023-02-23 HISTORY — PX: OTHER SURGICAL HISTORY: SHX169

## 2023-02-23 MED ORDER — OXYCODONE HCL 5 MG PO TABS
5.0000 mg | ORAL_TABLET | ORAL | 0 refills | Status: DC | PRN
Start: 2023-02-23 — End: 2023-06-08
  Filled 2023-02-23: qty 8, 2d supply, fill #0

## 2023-03-15 ENCOUNTER — Other Ambulatory Visit: Payer: Self-pay | Admitting: Obstetrics and Gynecology

## 2023-03-15 DIAGNOSIS — Z3041 Encounter for surveillance of contraceptive pills: Secondary | ICD-10-CM

## 2023-03-16 ENCOUNTER — Other Ambulatory Visit: Payer: Self-pay | Admitting: Obstetrics and Gynecology

## 2023-03-16 ENCOUNTER — Other Ambulatory Visit: Payer: Self-pay

## 2023-03-16 DIAGNOSIS — Z3041 Encounter for surveillance of contraceptive pills: Secondary | ICD-10-CM

## 2023-03-17 ENCOUNTER — Other Ambulatory Visit: Payer: Self-pay

## 2023-03-17 ENCOUNTER — Telehealth: Payer: Self-pay

## 2023-03-17 ENCOUNTER — Other Ambulatory Visit: Payer: Self-pay | Admitting: Obstetrics and Gynecology

## 2023-03-17 DIAGNOSIS — Z3041 Encounter for surveillance of contraceptive pills: Secondary | ICD-10-CM

## 2023-03-17 MED ORDER — NORGESTIMATE-ETH ESTRADIOL 0.25-35 MG-MCG PO TABS
1.0000 | ORAL_TABLET | Freq: Every day | ORAL | 0 refills | Status: DC
Start: 2023-03-17 — End: 2023-06-08
  Filled 2023-03-17: qty 84, 84d supply, fill #0

## 2023-03-17 NOTE — Telephone Encounter (Signed)
Pt called triage requesting [facility] RF, advised she is due for her annual and to schedule it first, then I can send RF to last to annual appt. She has been scheduled for 05/11/23 [facility]. RF sent and pt aware.

## 2023-05-10 NOTE — Progress Notes (Deleted)
No chief complaint on file.    HPI:      [name] is a 27 y.o. G0P0000 who LMP was No LMP recorded., presents today for her annual examination. Her menses are monthly, lasting 7 days, light to mod flow. Mild dysmen, improved with NSAIDs.  BTB resolved with OCP change. Pt ok with duration of flow.    Sex activity: currently sexually active--contraception  OCPs. Last pap: 03/15/22 Results: normal Hx of STDs: none   There is a FH of breast cancer in her mat grt aunt, genetic testing not indicated. There is no FH of ovarian cancer. The patient does do self-breast exams. Had RT breast mass 10/18 and u/s showed RT breast fibroadenoma, several stable repeat u/s since; last one 12/22; now good to wait till age 89 for mammos. Area hasn't changed in size.   Tobacco use: The patient denies current or previous tobacco use. Alcohol use: social No drug use Exercise: very active   She does get adequate calcium and Vitamin D in her diet. Gardasil completed.    Past Medical History:  Diagnosis Date   Fibroadenoma of breast, right 05/2017   Human papilloma virus (HPV) type 9 vaccine administered    series completed   Migraines     Past Surgical History:  Procedure Laterality Date   NO PAST SURGERIES      Family History  Problem Relation Age of Onset   Hypertension Father    Migraines Paternal Grandfather    Alzheimer's disease Paternal Grandfather    Hypertension Paternal Grandfather    Breast cancer Other 37   Alzheimer's disease Paternal Grandmother    Hypertension Paternal Grandmother     Social History   Socioeconomic History   Marital status: Single    Spouse name: Not on file   Number of children: Not on file   Years of education: Not on file   Highest education level: Not on file  Occupational History   Not on file  Tobacco Use   Smoking status: Never   Smokeless tobacco: Never  Vaping Use   Vaping status: Never Used  Substance and Sexual Activity    Alcohol use: No   Drug use: No   Sexual activity: Yes    Birth control/protection: Pill  Other Topics Concern   Not on file  Social History Narrative   Not on file   Social Determinants of Health   Financial Resource Strain: Low Risk  (01/18/2023)   Received from [facility]   Overall Financial Resource Strain (CARDIA)    Difficulty of Paying Living Expenses: Not hard at all  Food Insecurity: No Food Insecurity (01/18/2023)   Received from [facility]   Hunger Vital Sign    Worried About Running Out of Food in the Last Year: Never true    Ran Out of Food in the Last Year: Never true  Transportation Needs: No Transportation Needs (01/18/2023)   Received from [facility] - Transportation    In the past 12 months, has lack of transportation kept you from medical appointments or from getting medications?: No    Lack of Transportation (Non-Medical): No  Physical Activity: Not on file  Stress: Not on file  Social Connections: Not on file  Intimate Partner Violence: Not on file    Current Outpatient Medications on File Prior to Visit  Medication Sig Dispense Refill   cephALEXin (KEFLEX) 500 MG capsule Take 1 capsule (  500 mg total) by mouth 2 (two) times daily for 10 days. 21 capsule 0   D-Mannose 500 MG CAPS Take by mouth.     fluticasone (FLONASE) 50 MCG/ACT nasal spray Place into the nose.     HYDROcodone-acetaminophen (NORCO/VICODIN) 5-325 MG tablet Take 1 tablet by mouth every 4 to 6 hours as needed for pain. 16 tablet 0   magnesium oxide (MAG-OX) 400 MG tablet Take by mouth.     norgestimate-ethinyl estradiol (ORTHO-CYCLEN) 0.25-35 MG-MCG tablet Take 1 tablet by mouth daily. 84 tablet 0   oxyCODONE (OXY IR/ROXICODONE) 5 MG immediate release tablet Take 1 tablet (5 mg total) by mouth every four (4) hours as needed for pain for up to 8 doses. 8 tablet 0   promethazine (PHENERGAN) 25 MG tablet Take 1 tablet (25 mg total)  by mouth every 6 (six) hours as needed for nausea. 10 tablet 0   No current facility-administered medications on file prior to visit.    ROS:  Review of Systems  Constitutional:  Negative for fatigue, fever and unexpected weight change.  Respiratory:  Negative for cough, shortness of breath and wheezing.   Cardiovascular:  Negative for chest pain, palpitations and leg swelling.  Gastrointestinal:  Negative for blood in stool, constipation, diarrhea, nausea and vomiting.  Endocrine: Negative for cold intolerance, heat intolerance and polyuria.  Genitourinary:  Negative for dyspareunia, dysuria, flank pain, frequency, genital sores, hematuria, menstrual problem, pelvic pain, urgency, vaginal bleeding, vaginal discharge and vaginal pain.  Musculoskeletal:  Negative for back pain, joint swelling and myalgias.  Skin:  Negative for rash.  Neurological:  Negative for dizziness, syncope, light-headedness, numbness and headaches.  Hematological:  Negative for adenopathy.  Psychiatric/Behavioral:  Negative for agitation, confusion, sleep disturbance and suicidal ideas. The patient is not nervous/anxious.     Objective: There were no vitals taken for this visit.   Physical Exam Constitutional:      Appearance: She is well-developed.  Genitourinary:     Vulva normal.     Right Labia: No rash, tenderness or lesions.    Left Labia: No tenderness, lesions or rash.    No vaginal discharge, erythema or tenderness.      Right Adnexa: not tender and no mass present.    Left Adnexa: not tender and no mass present.    No cervical friability or polyp.     Uterus is not enlarged or tender.  Breasts:    Right: No mass, nipple discharge, skin change or tenderness.     Left: No mass, nipple discharge, skin change or tenderness.  Neck:     Thyroid: No thyromegaly.  Cardiovascular:     Rate and Rhythm: Normal rate and regular rhythm.     Heart sounds: Normal heart sounds. No murmur  heard. Pulmonary:     Effort: Pulmonary effort is normal.     Breath sounds: Normal breath sounds.  Abdominal:     Palpations: Abdomen is soft.     Tenderness: There is no abdominal tenderness. There is no guarding or rebound.  Musculoskeletal:        General: Normal range of motion.     Cervical back: Normal range of motion.  Lymphadenopathy:     Cervical: No cervical adenopathy.  Neurological:     General: No focal deficit present.     Mental Status: She is alert and oriented to person, place, and time.     Cranial Nerves: No cranial nerve deficit.  Skin:    General: Skin  is warm and dry.  Psychiatric:        Mood and Affect: Mood normal.        Behavior: Behavior normal.        Thought Content: Thought content normal.        Judgment: Judgment normal.  Vitals reviewed.     Assessment/Plan: Encounter for annual routine gynecological examination  Cervical cancer screening - Plan: Cytology - PAP  Screening for STD (sexually transmitted disease) - Plan: Cytology - PAP  Encounter for surveillance of contraceptive pills - Plan: norgestimate-ethinyl estradiol (ORTHO-CYCLEN) 0.25-35 MG-MCG tablet; OCP RF  Fibroadenoma of breast, right - stable, no further imaging needed at this time.   No orders of the defined types were placed in this encounter.             GYN counsel breast self exam, adequate intake of calcium and vitamin D, diet and exercise     F/U  No follow-ups on file.  Kingdavid Leinbach B. Oliverio Cho, PA-C 05/10/2023 3:20 PM

## 2023-05-11 ENCOUNTER — Ambulatory Visit: Payer: BC Managed Care – PPO | Admitting: Obstetrics and Gynecology

## 2023-05-11 DIAGNOSIS — Z01419 Encounter for gynecological examination (general) (routine) without abnormal findings: Secondary | ICD-10-CM

## 2023-05-11 DIAGNOSIS — Z3041 Encounter for surveillance of contraceptive pills: Secondary | ICD-10-CM

## 2023-06-06 NOTE — Progress Notes (Unsigned)
No chief complaint on file.    HPI:      Tina Peterson is a 27 y.o. G0P0000 who LMP was No LMP recorded., presents today for her annual examination. Her menses are monthly, lasting 7 days, light to mod flow. Mild dysmen, improved with NSAIDs.  BTB resolved with OCP change. Pt ok with duration of flow.    Sex activity: currently sexually active--contraception  OCPs. Last pap: 03/15/22 Results: normal Hx of STDs: none   There is a FH of breast cancer in her mat grt aunt, genetic testing not indicated. There is no FH of ovarian cancer. The patient does do self-breast exams. Had RT breast mass 10/18 and u/s showed RT breast fibroadenoma, several stable repeat u/s since; last one 12/22; now good to wait till age 64 for mammos. Area hasn't changed in size.   Tobacco use: The patient denies current or previous tobacco use. Alcohol use: social No drug use Exercise: very active   She does get adequate calcium and Vitamin D in her diet. Gardasil completed.    Past Medical History:  Diagnosis Date   Fibroadenoma of breast, right 05/2017   Human papilloma virus (HPV) type 9 vaccine administered    series completed   Migraines     Past Surgical History:  Procedure Laterality Date   NO PAST SURGERIES      Family History  Problem Relation Age of Onset   Hypertension Father    Migraines Paternal Grandfather    Alzheimer's disease Paternal Grandfather    Hypertension Paternal Grandfather    Breast cancer Other 19   Alzheimer's disease Paternal Grandmother    Hypertension Paternal Grandmother     Social History   Socioeconomic History   Marital status: Single    Spouse name: Not on file   Number of children: Not on file   Years of education: Not on file   Highest education level: Not on file  Occupational History   Not on file  Tobacco Use   Smoking status: Never   Smokeless tobacco: Never  Vaping Use   Vaping status: Never Used  Substance and Sexual Activity    Alcohol use: No   Drug use: No   Sexual activity: Yes    Birth control/protection: Pill  Other Topics Concern   Not on file  Social History Narrative   Not on file   Social Determinants of Health   Financial Resource Strain: Low Risk  (01/18/2023)   Received from [facility]   Overall Financial Resource Strain (CARDIA)    Difficulty of Paying Living Expenses: Not hard at all  Food Insecurity: No Food Insecurity (01/18/2023)   Received from [facility]   Hunger Vital Sign    Worried About Running Out of Food in the Last Year: Never true    Ran Out of Food in the Last Year: Never true  Transportation Needs: No Transportation Needs (01/18/2023)   Received from [facility] - Transportation    In the past 12 months, has lack of transportation kept you from medical appointments or from getting medications?: No    Lack of Transportation (Non-Medical): No  Physical Activity: Not on file  Stress: Not on file  Social Connections: Not on file  Intimate Partner Violence: Not on file    Current Outpatient Medications on File Prior to Visit  Medication Sig Dispense Refill   cephALEXin (KEFLEX) 500 MG capsule Take 1 capsule (  500 mg total) by mouth 2 (two) times daily for 10 days. 21 capsule 0   D-Mannose 500 MG CAPS Take by mouth.     fluticasone (FLONASE) 50 MCG/ACT nasal spray Place into the nose.     HYDROcodone-acetaminophen (NORCO/VICODIN) 5-325 MG tablet Take 1 tablet by mouth every 4 to 6 hours as needed for pain. 16 tablet 0   magnesium oxide (MAG-OX) 400 MG tablet Take by mouth.     norgestimate-ethinyl estradiol (ORTHO-CYCLEN) 0.25-35 MG-MCG tablet Take 1 tablet by mouth daily. 84 tablet 0   oxyCODONE (OXY IR/ROXICODONE) 5 MG immediate release tablet Take 1 tablet (5 mg total) by mouth every four (4) hours as needed for pain for up to 8 doses. 8 tablet 0   promethazine (PHENERGAN) 25 MG tablet Take 1 tablet (25 mg total)  by mouth every 6 (six) hours as needed for nausea. 10 tablet 0   No current facility-administered medications on file prior to visit.    ROS:  Review of Systems  Constitutional:  Negative for fatigue, fever and unexpected weight change.  Respiratory:  Negative for cough, shortness of breath and wheezing.   Cardiovascular:  Negative for chest pain, palpitations and leg swelling.  Gastrointestinal:  Negative for blood in stool, constipation, diarrhea, nausea and vomiting.  Endocrine: Negative for cold intolerance, heat intolerance and polyuria.  Genitourinary:  Negative for dyspareunia, dysuria, flank pain, frequency, genital sores, hematuria, menstrual problem, pelvic pain, urgency, vaginal bleeding, vaginal discharge and vaginal pain.  Musculoskeletal:  Negative for back pain, joint swelling and myalgias.  Skin:  Negative for rash.  Neurological:  Negative for dizziness, syncope, light-headedness, numbness and headaches.  Hematological:  Negative for adenopathy.  Psychiatric/Behavioral:  Negative for agitation, confusion, sleep disturbance and suicidal ideas. The patient is not nervous/anxious.     Objective: There were no vitals taken for this visit.   Physical Exam Constitutional:      Appearance: She is well-developed.  Genitourinary:     Vulva normal.     Right Labia: No rash, tenderness or lesions.    Left Labia: No tenderness, lesions or rash.    No vaginal discharge, erythema or tenderness.      Right Adnexa: not tender and no mass present.    Left Adnexa: not tender and no mass present.    No cervical friability or polyp.     Uterus is not enlarged or tender.  Breasts:    Right: No mass, nipple discharge, skin change or tenderness.     Left: No mass, nipple discharge, skin change or tenderness.  Neck:     Thyroid: No thyromegaly.  Cardiovascular:     Rate and Rhythm: Normal rate and regular rhythm.     Heart sounds: Normal heart sounds. No murmur  heard. Pulmonary:     Effort: Pulmonary effort is normal.     Breath sounds: Normal breath sounds.  Abdominal:     Palpations: Abdomen is soft.     Tenderness: There is no abdominal tenderness. There is no guarding or rebound.  Musculoskeletal:        General: Normal range of motion.     Cervical back: Normal range of motion.  Lymphadenopathy:     Cervical: No cervical adenopathy.  Neurological:     General: No focal deficit present.     Mental Status: She is alert and oriented to person, place, and time.     Cranial Nerves: No cranial nerve deficit.  Skin:    General: Skin  is warm and dry.  Psychiatric:        Mood and Affect: Mood normal.        Behavior: Behavior normal.        Thought Content: Thought content normal.        Judgment: Judgment normal.  Vitals reviewed.     Assessment/Plan: Encounter for annual routine gynecological examination  Cervical cancer screening - Plan: Cytology - PAP  Screening for STD (sexually transmitted disease) - Plan: Cytology - PAP  Encounter for surveillance of contraceptive pills - Plan: norgestimate-ethinyl estradiol (ORTHO-CYCLEN) 0.25-35 MG-MCG tablet; OCP RF  Fibroadenoma of breast, right - stable, no further imaging needed at this time.   No orders of the defined types were placed in this encounter.             GYN counsel breast self exam, adequate intake of calcium and vitamin D, diet and exercise     F/U  No follow-ups on file.  Shaketha Jeon B. Jenniferlynn Saad, PA-C 06/06/2023 3:49 PM

## 2023-06-07 ENCOUNTER — Encounter: Payer: Self-pay | Admitting: Obstetrics and Gynecology

## 2023-06-08 ENCOUNTER — Other Ambulatory Visit: Payer: Self-pay

## 2023-06-08 ENCOUNTER — Encounter: Payer: Self-pay | Admitting: Obstetrics and Gynecology

## 2023-06-08 ENCOUNTER — Ambulatory Visit (INDEPENDENT_AMBULATORY_CARE_PROVIDER_SITE_OTHER): Payer: BC Managed Care – PPO | Admitting: Obstetrics and Gynecology

## 2023-06-08 VITALS — BP 104/70 | Ht 65.0 in | Wt 112.0 lb

## 2023-06-08 DIAGNOSIS — Z01419 Encounter for gynecological examination (general) (routine) without abnormal findings: Secondary | ICD-10-CM

## 2023-06-08 DIAGNOSIS — Z3041 Encounter for surveillance of contraceptive pills: Secondary | ICD-10-CM

## 2023-06-08 DIAGNOSIS — N644 Mastodynia: Secondary | ICD-10-CM

## 2023-06-08 MED ORDER — NORGESTIMATE-ETH ESTRADIOL 0.25-35 MG-MCG PO TABS
1.0000 | ORAL_TABLET | Freq: Every day | ORAL | 3 refills | Status: DC
Start: 2023-06-08 — End: 2024-05-10
  Filled 2023-06-08: qty 84, 84d supply, fill #0
  Filled 2023-08-30: qty 84, 84d supply, fill #1
  Filled 2023-11-23: qty 84, 84d supply, fill #2
  Filled 2024-02-13: qty 84, 84d supply, fill #3

## 2023-06-08 NOTE — Patient Instructions (Signed)
I value your feedback and you entrusting us with your care. If you get a [facility] patient survey, I would appreciate you taking the time to let us know about your experience today. Thank you! ? ? ?

## 2023-07-12 ENCOUNTER — Other Ambulatory Visit: Payer: Self-pay

## 2023-07-12 MED ORDER — MELOXICAM 7.5 MG PO TABS
7.5000 mg | ORAL_TABLET | Freq: Two times a day (BID) | ORAL | 1 refills | Status: AC
  Filled 2023-07-12: qty 30, 15d supply, fill #0
  Filled 2023-08-21: qty 30, 15d supply, fill #1

## 2023-08-22 ENCOUNTER — Other Ambulatory Visit: Payer: Self-pay

## 2024-05-08 ENCOUNTER — Other Ambulatory Visit: Payer: Self-pay | Admitting: Obstetrics and Gynecology

## 2024-05-08 DIAGNOSIS — Z3041 Encounter for surveillance of contraceptive pills: Secondary | ICD-10-CM

## 2024-05-09 ENCOUNTER — Other Ambulatory Visit: Payer: Self-pay

## 2024-05-09 ENCOUNTER — Other Ambulatory Visit: Payer: Self-pay | Admitting: Obstetrics and Gynecology

## 2024-05-09 DIAGNOSIS — Z3041 Encounter for surveillance of contraceptive pills: Secondary | ICD-10-CM

## 2024-05-10 ENCOUNTER — Other Ambulatory Visit: Payer: Self-pay | Admitting: Obstetrics and Gynecology

## 2024-05-10 ENCOUNTER — Other Ambulatory Visit: Payer: Self-pay

## 2024-05-10 DIAGNOSIS — Z3041 Encounter for surveillance of contraceptive pills: Secondary | ICD-10-CM

## 2024-05-10 MED ORDER — NORGESTIMATE-ETH ESTRADIOL 0.25-35 MG-MCG PO TABS
1.0000 | ORAL_TABLET | Freq: Every day | ORAL | 0 refills | Status: DC
  Filled 2024-05-10: qty 84, 84d supply, fill #0

## 2024-05-10 NOTE — Telephone Encounter (Signed)
 Patient has annual scheduled for 06/13/24

## 2024-06-13 ENCOUNTER — Encounter: Payer: Self-pay | Admitting: Obstetrics and Gynecology

## 2024-06-13 ENCOUNTER — Other Ambulatory Visit: Payer: Self-pay

## 2024-06-13 ENCOUNTER — Other Ambulatory Visit (HOSPITAL_COMMUNITY)
Admission: RE | Admit: 2024-06-13 | Discharge: 2024-06-13 | Disposition: A | Discharge status: Home / Self Care | Source: Ambulatory Visit | Attending: Obstetrics and Gynecology | Admitting: Obstetrics and Gynecology

## 2024-06-13 ENCOUNTER — Ambulatory Visit (INDEPENDENT_AMBULATORY_CARE_PROVIDER_SITE_OTHER): Admitting: Obstetrics and Gynecology

## 2024-06-13 VITALS — BP 137/83 | HR 71 | Ht 65.0 in | Wt 116.0 lb

## 2024-06-13 DIAGNOSIS — Z3041 Encounter for surveillance of contraceptive pills: Secondary | ICD-10-CM

## 2024-06-13 DIAGNOSIS — Z01411 Encounter for gynecological examination (general) (routine) with abnormal findings: Secondary | ICD-10-CM

## 2024-06-13 DIAGNOSIS — R1032 Left lower quadrant pain: Secondary | ICD-10-CM | POA: Diagnosis not present

## 2024-06-13 DIAGNOSIS — Z124 Encounter for screening for malignant neoplasm of cervix: Secondary | ICD-10-CM | POA: Insufficient documentation

## 2024-06-13 DIAGNOSIS — Z01419 Encounter for gynecological examination (general) (routine) without abnormal findings: Secondary | ICD-10-CM

## 2024-06-13 MED ORDER — NORGESTIMATE-ETH ESTRADIOL 0.25-35 MG-MCG PO TABS
1.0000 | ORAL_TABLET | Freq: Every day | ORAL | 3 refills | Status: AC
  Filled 2024-06-13 – 2024-07-29 (×2): qty 84, 84d supply, fill #0

## 2024-06-13 NOTE — Progress Notes (Signed)
 Chief Complaint  Patient presents with   Annual Exam     HPI:      [name] is a 28 y.o. G0P0000 who LMP was Patient's last menstrual period was 06/06/2024., presents today for her annual examination. Her menses are monthly on OCPs, lasting 5 days, light to mod flow. No BTB, usually no dysmen. Has had severe LLQ pain every other month with her period the past 6 months, lasting close to an hr. Improves with tylenol  and heating pad. Pt is doubled over in pain, almost passed out once due to pain. Sx then resolve. No pain if not on period. No pain with sex.    Sex activity: currently sexually active--contraception  OCPs. No pain/bleeding/dryness Last pap: 03/15/22 Results: normal Hx of STDs: none   There is a FH of breast cancer in her mat grt aunt, genetic testing not indicated. There is no FH of ovarian cancer. The patient does do self-breast exams. Had RT breast mass 10/18 and u/s showed RT breast fibroadenoma, several stable repeat u/s since; last one 12/22; now good to wait till age 62 for mammos.   Tobacco use: The patient denies current or previous tobacco use. Alcohol use: social No drug use Exercise: very active   She does get adequate calcium and Vitamin D in her diet. Gardasil completed.    Past Medical History:  Diagnosis Date   Fibroadenoma of breast, right 05/2017   Human papilloma virus (HPV) type 9 vaccine administered    series completed   Migraines     Past Surgical History:  Procedure Laterality Date   OTHER SURGICAL HISTORY  02/23/2023   Nasal Reconstruction at [facility]    Family History  Problem Relation Age of Onset   Hypertension Father    Migraines Paternal Grandfather    Alzheimer's disease Paternal Grandfather    Hypertension Paternal Grandfather    Breast cancer Other 60   Alzheimer's disease Paternal Grandmother    Hypertension Paternal Grandmother     Social History   Socioeconomic History   Marital status: Single    Spouse name: Not  on file   Number of children: Not on file   Years of education: Not on file   Highest education level: Not on file  Occupational History   Not on file  Tobacco Use   Smoking status: Never   Smokeless tobacco: Never  Vaping Use   Vaping status: Never Used  Substance and Sexual Activity   Alcohol use: Yes    Comment: soc   Drug use: No   Sexual activity: Yes    Birth control/protection: Pill  Other Topics Concern   Not on file  Social History Narrative   Not on file   Social Drivers of Health   Financial Resource Strain: Low Risk  (01/23/2024)   Received from [facility]   Overall Financial Resource Strain (CARDIA)    Difficulty of Paying Living Expenses: Not very hard  Food Insecurity: No Food Insecurity (01/23/2024)   Received from [facility]   Hunger Vital Sign    Within the past 12 months, you worried that your food would run out before you got the money to buy more.: Never true    Within the past 12 months, the food you bought just didn't last and you didn't have money to get more.: Never true  Transportation Needs: No Transportation Needs (01/23/2024)   Received from [facility]   PRAPARE -  Transportation    In the past 12 months, has lack of transportation kept you from medical appointments or from getting medications?: No    Lack of Transportation (Non-Medical): No  Physical Activity: Not on file  Stress: Not on file  Social Connections: Not on file  Intimate Partner Violence: Not on file    Current Outpatient Medications on File Prior to Visit  Medication Sig Dispense Refill   fluticasone (FLONASE) 50 MCG/ACT nasal spray Place into the nose.     magnesium oxide (MAG-OX) 400 MG tablet Take by mouth.     meloxicam  (MOBIC ) 7.5 MG tablet Take 1 tablet (7.5 mg total) by mouth 2 (two) times daily. 30 tablet 1   No current facility-administered medications on file prior to visit.    ROS:  Review of Systems   Constitutional:  Negative for fatigue, fever and unexpected weight change.  Respiratory:  Negative for cough, shortness of breath and wheezing.   Cardiovascular:  Negative for chest pain, palpitations and leg swelling.  Gastrointestinal:  Negative for blood in stool, constipation, diarrhea, nausea and vomiting.  Endocrine: Negative for cold intolerance, heat intolerance and polyuria.  Genitourinary:  Positive for pelvic pain. Negative for dyspareunia, dysuria, flank pain, frequency, genital sores, hematuria, menstrual problem, urgency, vaginal bleeding, vaginal discharge and vaginal pain.  Musculoskeletal:  Negative for back pain, joint swelling and myalgias.  Skin:  Negative for rash.  Neurological:  Negative for dizziness, syncope, light-headedness, numbness and headaches.  Hematological:  Negative for adenopathy.  Psychiatric/Behavioral:  Negative for agitation, confusion, sleep disturbance and suicidal ideas. The patient is not nervous/anxious.     Objective: BP 137/83   Pulse 71   Ht 5' 5 (1.651 m)   Wt 116 lb (52.6 kg)   LMP 06/06/2024   BMI 19.30 kg/m    Physical Exam Constitutional:      Appearance: She is well-developed.  Genitourinary:     Vulva normal.     Right Labia: No rash, tenderness or lesions.    Left Labia: No tenderness, lesions or rash.    No vaginal discharge, erythema or tenderness.      Right Adnexa: not tender and no mass present.    Left Adnexa: not tender and no mass present.    No cervical friability or polyp.     Uterus is not enlarged or tender.  Breasts:    Right: No mass, nipple discharge, skin change or tenderness.     Left: No mass, nipple discharge, skin change or tenderness.  Neck:     Thyroid: No thyromegaly.  Cardiovascular:     Rate and Rhythm: Normal rate and regular rhythm.     Heart sounds: Normal heart sounds. No murmur heard. Pulmonary:     Effort: Pulmonary effort is normal.     Breath sounds: Normal breath sounds.   Abdominal:     Palpations: Abdomen is soft.     Tenderness: There is no abdominal tenderness. There is no guarding or rebound.  Musculoskeletal:        General: Normal range of motion.     Cervical back: Normal range of motion.  Lymphadenopathy:     Cervical: No cervical adenopathy.  Neurological:     General: No focal deficit present.     Mental Status: She is alert and oriented to person, place, and time.     Cranial Nerves: No cranial nerve deficit.  Skin:    General: Skin is warm and dry.  Psychiatric:  Mood and Affect: Mood normal.        Behavior: Behavior normal.        Thought Content: Thought content normal.        Judgment: Judgment normal.  Vitals reviewed.     Assessment/Plan: Encounter for annual routine gynecological examination  Cervical cancer screening - Plan: Cytology - PAP  Encounter for surveillance of contraceptive pills - Plan: norgestimate -ethinyl estradiol  (VYLIBRA ) 0.25-35 MG-MCG tablet; Rx RF eRxd.   LLQ pain - Plan: US  PELVIS TRANSVAGINAL NON-OB (TV ONLY); QO month with menses for 6 months, check GYN u/s. Will f/u with results.   Fibroadenoma of breast, right - stable, no further imaging needed at this time.   Meds ordered this encounter  Medications   norgestimate -ethinyl estradiol  (VYLIBRA ) 0.25-35 MG-MCG tablet    Sig: Take 1 tablet by mouth daily.    Dispense:  84 tablet    Refill:  3    Supervising Provider:   ROBY, MICIA [[id number]]              GYN counsel breast self exam, adequate intake of calcium and vitamin D, diet and exercise     F/U  Return in about 1 week (around 06/20/2024) for GYN u/s for LLQ pain--[facility] to call pt.  Blaike Newburn B. Bellarose Burtt, PA-C 06/13/2024 4:31 PM

## 2024-06-13 NOTE — Patient Instructions (Signed)
 I value your feedback and you entrusting [place] with your care. If you get a [facility] patient survey, I would appreciate you taking the time to let us know about your experience today. Thank you! ? ? ?

## 2024-06-17 LAB — CYTOLOGY - PAP
Adequacy: ABSENT
Diagnosis: NEGATIVE

## 2024-06-24 ENCOUNTER — Ambulatory Visit

## 2024-06-24 DIAGNOSIS — R1032 Left lower quadrant pain: Secondary | ICD-10-CM | POA: Diagnosis not present

## 2024-06-26 ENCOUNTER — Encounter: Payer: Self-pay | Admitting: Obstetrics and Gynecology

## 2024-06-28 ENCOUNTER — Other Ambulatory Visit: Payer: Self-pay | Admitting: Obstetrics and Gynecology

## 2024-06-28 ENCOUNTER — Other Ambulatory Visit: Payer: Self-pay

## 2024-06-28 DIAGNOSIS — R1032 Left lower quadrant pain: Secondary | ICD-10-CM

## 2024-06-28 MED ORDER — IBUPROFEN 800 MG PO TABS
800.0000 mg | ORAL_TABLET | Freq: Three times a day (TID) | ORAL | 1 refills | Status: AC | PRN
  Filled 2024-06-28: qty 30, 10d supply, fill #0

## 2024-06-28 NOTE — Progress Notes (Signed)
 Rx ibup for LLQ pain with menses

## 2024-07-22 NOTE — Telephone Encounter (Signed)
 This encounter was created in error - please disregard.

## 2024-07-29 ENCOUNTER — Other Ambulatory Visit: Payer: Self-pay
